# Patient Record
Sex: Female | Born: 1945
Health system: Southern US, Community
[De-identification: ages and names within clinical notes are randomized; demographics above are authoritative.]

## PROBLEM LIST (undated history)

## (undated) DIAGNOSIS — T4145XA Adverse effect of unspecified anesthetic, initial encounter: Secondary | ICD-10-CM

## (undated) DIAGNOSIS — Z8679 Personal history of other diseases of the circulatory system: Secondary | ICD-10-CM

## (undated) DIAGNOSIS — E785 Hyperlipidemia, unspecified: Secondary | ICD-10-CM

## (undated) DIAGNOSIS — K219 Gastro-esophageal reflux disease without esophagitis: Secondary | ICD-10-CM

## (undated) DIAGNOSIS — Z8719 Personal history of other diseases of the digestive system: Secondary | ICD-10-CM

## (undated) DIAGNOSIS — C4491 Basal cell carcinoma of skin, unspecified: Secondary | ICD-10-CM

## (undated) DIAGNOSIS — Z8481 Family history of carrier of genetic disease: Secondary | ICD-10-CM

## (undated) DIAGNOSIS — Z8541 Personal history of malignant neoplasm of cervix uteri: Secondary | ICD-10-CM

## (undated) DIAGNOSIS — Z9889 Other specified postprocedural states: Secondary | ICD-10-CM

## (undated) DIAGNOSIS — J3089 Other allergic rhinitis: Secondary | ICD-10-CM

## (undated) DIAGNOSIS — J189 Pneumonia, unspecified organism: Secondary | ICD-10-CM

## (undated) DIAGNOSIS — M1990 Unspecified osteoarthritis, unspecified site: Secondary | ICD-10-CM

## (undated) DIAGNOSIS — H9190 Unspecified hearing loss, unspecified ear: Secondary | ICD-10-CM

## (undated) DIAGNOSIS — F53 Postpartum depression: Secondary | ICD-10-CM

## (undated) DIAGNOSIS — R0600 Dyspnea, unspecified: Secondary | ICD-10-CM

## (undated) DIAGNOSIS — R011 Cardiac murmur, unspecified: Secondary | ICD-10-CM

## (undated) DIAGNOSIS — R112 Nausea with vomiting, unspecified: Secondary | ICD-10-CM

## (undated) DIAGNOSIS — E039 Hypothyroidism, unspecified: Secondary | ICD-10-CM

## (undated) DIAGNOSIS — E079 Disorder of thyroid, unspecified: Secondary | ICD-10-CM

## (undated) DIAGNOSIS — T8859XA Other complications of anesthesia, initial encounter: Secondary | ICD-10-CM

## (undated) DIAGNOSIS — M719 Bursopathy, unspecified: Secondary | ICD-10-CM

## (undated) DIAGNOSIS — N183 Chronic kidney disease, stage 3 unspecified: Secondary | ICD-10-CM

## (undated) DIAGNOSIS — M797 Fibromyalgia: Secondary | ICD-10-CM

## (undated) DIAGNOSIS — J45909 Unspecified asthma, uncomplicated: Secondary | ICD-10-CM

## (undated) DIAGNOSIS — E559 Vitamin D deficiency, unspecified: Secondary | ICD-10-CM

## (undated) DIAGNOSIS — G43909 Migraine, unspecified, not intractable, without status migrainosus: Secondary | ICD-10-CM

## (undated) DIAGNOSIS — H9319 Tinnitus, unspecified ear: Secondary | ICD-10-CM

## (undated) DIAGNOSIS — I10 Essential (primary) hypertension: Secondary | ICD-10-CM

## (undated) HISTORY — DX: Family history of carrier of genetic disease: Z84.81

## (undated) HISTORY — PX: BREAST LUMPECTOMY: SHX2

## (undated) HISTORY — PX: ELBOW SURGERY: SHX618

## (undated) HISTORY — PX: ABDOMINAL HYSTERECTOMY: SHX81

## (undated) HISTORY — DX: Personal history of malignant neoplasm of cervix uteri: Z85.41

---
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
## 2001-04-15 ENCOUNTER — Other Ambulatory Visit: Admission: RE | Admit: 2001-04-15 | Discharge: 2001-04-15 | Payer: Self-pay | Admitting: Family Medicine

## 2002-04-10 ENCOUNTER — Other Ambulatory Visit: Admission: RE | Admit: 2002-04-10 | Discharge: 2002-04-10 | Payer: Self-pay | Admitting: Family Medicine

## 2002-09-20 ENCOUNTER — Emergency Department (HOSPITAL_COMMUNITY): Admission: EM | Admit: 2002-09-20 | Discharge: 2002-09-20 | Payer: Self-pay | Admitting: Emergency Medicine

## 2002-09-20 ENCOUNTER — Encounter: Payer: Self-pay | Admitting: Emergency Medicine

## 2003-06-16 ENCOUNTER — Other Ambulatory Visit: Admission: RE | Admit: 2003-06-16 | Discharge: 2003-06-16 | Payer: Self-pay | Admitting: Family Medicine

## 2004-08-03 ENCOUNTER — Encounter (HOSPITAL_COMMUNITY): Admission: RE | Admit: 2004-08-03 | Discharge: 2004-08-09 | Payer: Self-pay | Admitting: Family Medicine

## 2004-08-29 ENCOUNTER — Other Ambulatory Visit: Admission: RE | Admit: 2004-08-29 | Discharge: 2004-08-29 | Payer: Self-pay | Admitting: Family Medicine

## 2004-10-17 ENCOUNTER — Encounter (INDEPENDENT_AMBULATORY_CARE_PROVIDER_SITE_OTHER): Payer: Self-pay | Admitting: *Deleted

## 2004-10-17 ENCOUNTER — Ambulatory Visit (HOSPITAL_COMMUNITY): Admission: RE | Admit: 2004-10-17 | Discharge: 2004-10-17 | Payer: Self-pay | Admitting: Gastroenterology

## 2005-09-01 ENCOUNTER — Other Ambulatory Visit: Admission: RE | Admit: 2005-09-01 | Discharge: 2005-09-01 | Payer: Self-pay | Admitting: Family Medicine

## 2005-09-08 ENCOUNTER — Encounter: Admission: RE | Admit: 2005-09-08 | Discharge: 2005-09-08 | Payer: Self-pay | Admitting: Family Medicine

## 2005-09-27 ENCOUNTER — Encounter: Admission: RE | Admit: 2005-09-27 | Discharge: 2005-09-27 | Payer: Self-pay | Admitting: Family Medicine

## 2009-10-28 ENCOUNTER — Emergency Department (HOSPITAL_BASED_OUTPATIENT_CLINIC_OR_DEPARTMENT_OTHER): Admission: EM | Admit: 2009-10-28 | Discharge: 2009-10-28 | Payer: Self-pay | Admitting: Emergency Medicine

## 2011-04-21 NOTE — Op Note (Signed)
NAME:  Susan Webster, Susan Webster               ACCOUNT NO.:  [id number]   MEDICAL RECORD NO.:  [id number]          PATIENT TYPE:  AMB   LOCATION:  ENDO                         FACILITY:  [facility]   PHYSICIAN:  Anselmo Rod, M.D.  DATE OF BIRTH:  05/15/1946   DATE OF PROCEDURE:  10/17/2004  DATE OF DISCHARGE:                                 OPERATIVE REPORT   PROCEDURE PERFORMED:  Colonoscopy with biopsies.   ENDOSCOPIST:  Charna Elizabeth, M.D.   INSTRUMENT USED:  Olympus video colonoscope.   INDICATIONS FOR PROCEDURE:  The patient is a 65 year old white female  undergoing screening colonoscopy.  There is a history of breast and cervical  cancer in her family but no known history of colon cancer.  Rule out colonic  polyps, masses, etc.   PREPROCEDURE PREPARATION:  Informed consent was procured from the patient.  The patient was fasted for eight hours prior to the procedure and prepped  with a bottle of magnesium citrate and a gallon of GoLYTELY the night prior  to the procedure.   PREPROCEDURE PHYSICAL:  The patient had stable vital signs.  Neck supple.  Chest clear to auscultation.  S1 and S2 regular.  Abdomen soft with normal  bowel sounds.   DESCRIPTION OF PROCEDURE:  The patient was placed in left lateral decubitus  position and sedated with 60 mg of Demerol and 6 mg of Versed in slow  incremental doses.  Once the patient was adequately sedated and maintained  on low flow oxygen and continuous cardiac monitoring, the Olympus video  colonoscope was advanced from the rectum to the cecum.  The appendicular  orifice and ileocecal valve were clearly visualized and photographed.  The  terminal ileum appeared normal.  There was a prominent fold biopsied at 7-0  centimeters.  Small internal hemorrhoids were seen on retroflexion.  The  patient tolerated the procedure well without complications.   IMPRESSION:  1.  Sigmoid diverticulosis.  2.  Prominent fold biopsied at 70 cm.  3.  Small internal  hemorrhoids seen on retroflexion.  4.  Normal terminal ileum.   RECOMMENDATIONS:  1.  Continue high fiber diet with liberal fluid intake.  2.  Brochures on diverticulosis have been given to the patient for her      education.  3.  Await pathology results.  4.  Outpatient followup in the next two weeks for further recommendations.  5.  Avoid all nonsteroidals for the next two weeks.       JNM/MEDQ  D:  10/17/2004  T:  10/17/2004  Job:  [id number]   cc:   Talmadge Coventry, M.D.  [address]  [place]  [place] [address]  Fax: [phone]

## 2011-10-25 ENCOUNTER — Emergency Department (HOSPITAL_BASED_OUTPATIENT_CLINIC_OR_DEPARTMENT_OTHER)
Admission: EM | Admit: 2011-10-25 | Discharge: 2011-10-25 | Disposition: A | Discharge status: Home / Self Care | Payer: Medicare Other | Attending: Emergency Medicine | Admitting: Emergency Medicine

## 2011-10-25 ENCOUNTER — Encounter: Payer: Self-pay | Admitting: *Deleted

## 2011-10-25 DIAGNOSIS — R197 Diarrhea, unspecified: Secondary | ICD-10-CM | POA: Insufficient documentation

## 2011-10-25 DIAGNOSIS — E079 Disorder of thyroid, unspecified: Secondary | ICD-10-CM | POA: Insufficient documentation

## 2011-10-25 DIAGNOSIS — I10 Essential (primary) hypertension: Secondary | ICD-10-CM | POA: Insufficient documentation

## 2011-10-25 DIAGNOSIS — R112 Nausea with vomiting, unspecified: Secondary | ICD-10-CM | POA: Insufficient documentation

## 2011-10-25 DIAGNOSIS — IMO0001 Reserved for inherently not codable concepts without codable children: Secondary | ICD-10-CM | POA: Insufficient documentation

## 2011-10-25 DIAGNOSIS — Z8739 Personal history of other diseases of the musculoskeletal system and connective tissue: Secondary | ICD-10-CM | POA: Insufficient documentation

## 2011-10-25 HISTORY — DX: Unspecified osteoarthritis, unspecified site: M19.90

## 2011-10-25 HISTORY — DX: Essential (primary) hypertension: I10

## 2011-10-25 HISTORY — DX: Disorder of thyroid, unspecified: E07.9

## 2011-10-25 HISTORY — DX: Fibromyalgia: M79.7

## 2011-10-25 LAB — COMPREHENSIVE METABOLIC PANEL
BUN: 24 mg/dL — ABNORMAL HIGH (ref 6–23)
CO2: 28 mEq/L (ref 19–32)
Calcium: 9.9 mg/dL (ref 8.4–10.5)
Chloride: 99 mEq/L (ref 96–112)
Creatinine, Ser: 0.8 mg/dL (ref 0.50–1.10)
GFR calc Af Amer: 87 mL/min — ABNORMAL LOW (ref >90)
GFR calc non Af Amer: 75 mL/min — ABNORMAL LOW (ref >90)
Glucose, Bld: 116 mg/dL — ABNORMAL HIGH (ref 70–99)
Total Bilirubin: 0.5 mg/dL (ref 0.3–1.2)

## 2011-10-25 LAB — URINALYSIS, ROUTINE W REFLEX MICROSCOPIC
Glucose, UA: NEGATIVE mg/dL
Hgb urine dipstick: NEGATIVE
Ketones, ur: 15 mg/dL — AB
Nitrite: NEGATIVE
Protein, ur: NEGATIVE mg/dL
Specific Gravity, Urine: 1.031 — ABNORMAL HIGH (ref 1.005–1.030)
Urobilinogen, UA: 1 mg/dL (ref 0.0–1.0)
pH: 5 (ref 5.0–8.0)

## 2011-10-25 LAB — CBC
HCT: 43.8 % (ref 36.0–46.0)
Hemoglobin: 15 g/dL (ref 12.0–15.0)
MCV: 86.9 fL (ref 78.0–100.0)
RBC: 5.04 MIL/uL (ref 3.87–5.11)
RDW: 13 % (ref 11.5–15.5)
WBC: 14.8 10*3/uL — ABNORMAL HIGH (ref 4.0–10.5)

## 2011-10-25 LAB — DIFFERENTIAL
Eosinophils Relative: 0 % (ref 0–5)
Lymphocytes Relative: 6 % — ABNORMAL LOW (ref 12–46)
Lymphs Abs: 0.9 10*3/uL (ref 0.7–4.0)
Monocytes Absolute: 0.4 10*3/uL (ref 0.1–1.0)
Monocytes Relative: 3 % (ref 3–12)

## 2011-10-25 LAB — URINE MICROSCOPIC-ADD ON

## 2011-10-25 MED ORDER — ONDANSETRON 4 MG PO TBDP: 4.0000 mg | ORAL_TABLET | Freq: Three times a day (TID) | ORAL | Status: AC | PRN

## 2011-10-25 MED ORDER — ONDANSETRON HCL 4 MG/2ML IJ SOLN
4.0000 mg | Freq: Once | INTRAMUSCULAR | Status: AC
  Administered 2011-10-25: 4 mg via INTRAVENOUS
  Filled 2011-10-25: qty 2

## 2011-10-25 MED ORDER — SODIUM CHLORIDE 0.9 % IV BOLUS (SEPSIS)
1000.0000 mL | Freq: Once | INTRAVENOUS | Status: AC
  Administered 2011-10-25: 1000 mL via INTRAVENOUS

## 2011-10-25 NOTE — ED Provider Notes (Signed)
History     CSN: [id number] Arrival date & time: 10/25/2011  7:30 PM   First MD Initiated Contact with Patient 10/25/11 2022      Chief Complaint  Patient presents with  . Emesis    (Consider location/radiation/quality/duration/timing/severity/associated sxs/prior treatment) HPI Comments: Pt states that she has had more vomiting then diarrhea:daughter has had similar symptoms this week  Patient is a 65 y.o. female presenting with vomiting. The history is provided by the patient. No language interpreter was used.  Emesis  This is a new problem. The current episode started 6 to 12 hours ago. The problem occurs more than 10 times per day. The problem has not changed since onset.The emesis has an appearance of stomach contents. There has been no fever. Associated symptoms include diarrhea. Pertinent negatives include no abdominal pain and no cough. Risk factors include ill contacts.    Past Medical History  Diagnosis Date  . Hypertension   . Fibromyalgia   . Thyroid disease   . Arthritis     Past Surgical History  Procedure Date  . Abdominal hysterectomy   . Breast lumpectomy   . Elbow surgery     No family history on file.  History  Substance Use Topics  . Smoking status: Never Smoker   . Smokeless tobacco: Not on file  . Alcohol Use: Yes    OB History    Grav Para Term Preterm Abortions TAB SAB Ect Mult Living                  Review of Systems  Respiratory: Negative for cough.   Gastrointestinal: Positive for vomiting and diarrhea. Negative for abdominal pain.  All other systems reviewed and are negative.    Allergies  Review of patient's allergies indicates not on file.  Home Medications  No current outpatient prescriptions on file.  BP 138/86  Pulse 106  Temp(Src) 98.4 F (36.9 C) (Oral)  Resp 21  SpO2 100%  Physical Exam  Vitals reviewed. Constitutional: She is oriented to person, place, and time. She appears well-developed.  HENT:  Right  Ear: External ear normal.  Left Ear: External ear normal.  Mouth/Throat: Mucous membranes are dry.  Eyes: Conjunctivae and EOM are normal.  Neck: Normal range of motion. Neck supple.  Cardiovascular: Normal rate and regular rhythm.   Pulmonary/Chest: Effort normal and breath sounds normal.  Abdominal: Soft.  Musculoskeletal: Normal range of motion.  Neurological: She is alert and oriented to person, place, and time.  Skin: Skin is warm and dry.  Psychiatric: She has a normal mood and affect.    ED Course  Procedures (including critical care time)  Labs Reviewed  URINALYSIS, ROUTINE W REFLEX MICROSCOPIC - Abnormal; Notable for the following:    Appearance CLOUDY (*)    Specific Gravity, Urine 1.031 (*)    Bilirubin Urine SMALL (*)    Ketones, ur 15 (*)    Leukocytes, UA SMALL (*)    All other components within normal limits  CBC - Abnormal; Notable for the following:    WBC 14.8 (*)    All other components within normal limits  DIFFERENTIAL - Abnormal; Notable for the following:    Neutrophils Relative 91 (*)    Neutro Abs 13.5 (*)    Lymphocytes Relative 6 (*)    All other components within normal limits  COMPREHENSIVE METABOLIC PANEL - Abnormal; Notable for the following:    Potassium 3.2 (*)    Glucose, Bld 116 (*)  BUN 24 (*)    GFR calc non Af Amer 75 (*)    GFR calc Af Amer 87 (*)    All other components within normal limits  URINE MICROSCOPIC-ADD ON   No results found.   1. Nausea, vomiting and diarrhea       MDM  Pt is feeling better at this time:pt is tolerating po fluids:symptoms likely viral as other family members with similar symptoms:will treat at home symptomatically        Teressa Lower, NP 10/25/11 2212

## 2011-10-25 NOTE — ED Notes (Signed)
Pt c/o N/V/D that began this am.

## 2011-10-26 NOTE — ED Provider Notes (Signed)
Medical screening examination/treatment/procedure(s) were performed by non-physician practitioner and as supervising physician I was immediately available for consultation/collaboration.   Moody Robben, MD 10/26/11 0007 

## 2012-01-26 DIAGNOSIS — H1045 Other chronic allergic conjunctivitis: Secondary | ICD-10-CM | POA: Diagnosis not present

## 2012-01-26 DIAGNOSIS — H40019 Open angle with borderline findings, low risk, unspecified eye: Secondary | ICD-10-CM | POA: Diagnosis not present

## 2012-01-26 DIAGNOSIS — H01009 Unspecified blepharitis unspecified eye, unspecified eyelid: Secondary | ICD-10-CM | POA: Diagnosis not present

## 2012-01-26 DIAGNOSIS — H04129 Dry eye syndrome of unspecified lacrimal gland: Secondary | ICD-10-CM | POA: Diagnosis not present

## 2012-05-27 DIAGNOSIS — I10 Essential (primary) hypertension: Secondary | ICD-10-CM | POA: Diagnosis not present

## 2012-05-27 DIAGNOSIS — R7301 Impaired fasting glucose: Secondary | ICD-10-CM | POA: Diagnosis not present

## 2012-05-27 DIAGNOSIS — E559 Vitamin D deficiency, unspecified: Secondary | ICD-10-CM | POA: Diagnosis not present

## 2012-05-27 DIAGNOSIS — E039 Hypothyroidism, unspecified: Secondary | ICD-10-CM | POA: Diagnosis not present

## 2012-05-27 DIAGNOSIS — E538 Deficiency of other specified B group vitamins: Secondary | ICD-10-CM | POA: Diagnosis not present

## 2012-06-03 DIAGNOSIS — E785 Hyperlipidemia, unspecified: Secondary | ICD-10-CM | POA: Diagnosis not present

## 2012-06-03 DIAGNOSIS — L570 Actinic keratosis: Secondary | ICD-10-CM | POA: Diagnosis not present

## 2012-06-03 DIAGNOSIS — I10 Essential (primary) hypertension: Secondary | ICD-10-CM | POA: Diagnosis not present

## 2012-06-03 DIAGNOSIS — R7301 Impaired fasting glucose: Secondary | ICD-10-CM | POA: Diagnosis not present

## 2012-06-03 DIAGNOSIS — M25519 Pain in unspecified shoulder: Secondary | ICD-10-CM | POA: Diagnosis not present

## 2012-06-03 DIAGNOSIS — E039 Hypothyroidism, unspecified: Secondary | ICD-10-CM | POA: Diagnosis not present

## 2012-06-10 DIAGNOSIS — M67919 Unspecified disorder of synovium and tendon, unspecified shoulder: Secondary | ICD-10-CM | POA: Diagnosis not present

## 2012-06-10 DIAGNOSIS — M25519 Pain in unspecified shoulder: Secondary | ICD-10-CM | POA: Diagnosis not present

## 2012-06-10 DIAGNOSIS — M719 Bursopathy, unspecified: Secondary | ICD-10-CM | POA: Diagnosis not present

## 2012-07-03 DIAGNOSIS — M67919 Unspecified disorder of synovium and tendon, unspecified shoulder: Secondary | ICD-10-CM | POA: Diagnosis not present

## 2012-07-03 DIAGNOSIS — M719 Bursopathy, unspecified: Secondary | ICD-10-CM | POA: Diagnosis not present

## 2012-07-17 DIAGNOSIS — M7150 Other bursitis, not elsewhere classified, unspecified site: Secondary | ICD-10-CM | POA: Diagnosis not present

## 2012-07-17 DIAGNOSIS — R5381 Other malaise: Secondary | ICD-10-CM | POA: Diagnosis not present

## 2012-07-17 DIAGNOSIS — R5383 Other fatigue: Secondary | ICD-10-CM | POA: Diagnosis not present

## 2012-07-17 DIAGNOSIS — F329 Major depressive disorder, single episode, unspecified: Secondary | ICD-10-CM | POA: Diagnosis not present

## 2012-07-19 DIAGNOSIS — M25519 Pain in unspecified shoulder: Secondary | ICD-10-CM | POA: Diagnosis not present

## 2012-07-24 DIAGNOSIS — M25519 Pain in unspecified shoulder: Secondary | ICD-10-CM | POA: Diagnosis not present

## 2012-07-26 DIAGNOSIS — M25519 Pain in unspecified shoulder: Secondary | ICD-10-CM | POA: Diagnosis not present

## 2012-07-29 DIAGNOSIS — M25519 Pain in unspecified shoulder: Secondary | ICD-10-CM | POA: Diagnosis not present

## 2012-08-12 DIAGNOSIS — M25519 Pain in unspecified shoulder: Secondary | ICD-10-CM | POA: Diagnosis not present

## 2012-08-14 DIAGNOSIS — M25519 Pain in unspecified shoulder: Secondary | ICD-10-CM | POA: Diagnosis not present

## 2012-08-19 DIAGNOSIS — M25519 Pain in unspecified shoulder: Secondary | ICD-10-CM | POA: Diagnosis not present

## 2012-08-21 DIAGNOSIS — M25519 Pain in unspecified shoulder: Secondary | ICD-10-CM | POA: Diagnosis not present

## 2012-08-26 DIAGNOSIS — M25519 Pain in unspecified shoulder: Secondary | ICD-10-CM | POA: Diagnosis not present

## 2012-08-28 DIAGNOSIS — M25519 Pain in unspecified shoulder: Secondary | ICD-10-CM | POA: Diagnosis not present

## 2012-09-02 DIAGNOSIS — M25519 Pain in unspecified shoulder: Secondary | ICD-10-CM | POA: Diagnosis not present

## 2012-09-04 DIAGNOSIS — M25519 Pain in unspecified shoulder: Secondary | ICD-10-CM | POA: Diagnosis not present

## 2012-09-09 DIAGNOSIS — M25519 Pain in unspecified shoulder: Secondary | ICD-10-CM | POA: Diagnosis not present

## 2012-09-11 DIAGNOSIS — M25519 Pain in unspecified shoulder: Secondary | ICD-10-CM | POA: Diagnosis not present

## 2012-10-04 DIAGNOSIS — H01009 Unspecified blepharitis unspecified eye, unspecified eyelid: Secondary | ICD-10-CM | POA: Diagnosis not present

## 2012-10-04 DIAGNOSIS — D3130 Benign neoplasm of unspecified choroid: Secondary | ICD-10-CM | POA: Diagnosis not present

## 2012-10-04 DIAGNOSIS — H43399 Other vitreous opacities, unspecified eye: Secondary | ICD-10-CM | POA: Diagnosis not present

## 2012-10-04 DIAGNOSIS — H2510 Age-related nuclear cataract, unspecified eye: Secondary | ICD-10-CM | POA: Diagnosis not present

## 2012-10-04 DIAGNOSIS — H40019 Open angle with borderline findings, low risk, unspecified eye: Secondary | ICD-10-CM | POA: Diagnosis not present

## 2012-11-08 DIAGNOSIS — H01009 Unspecified blepharitis unspecified eye, unspecified eyelid: Secondary | ICD-10-CM | POA: Diagnosis not present

## 2012-11-08 DIAGNOSIS — H1045 Other chronic allergic conjunctivitis: Secondary | ICD-10-CM | POA: Diagnosis not present

## 2012-11-14 DIAGNOSIS — M899 Disorder of bone, unspecified: Secondary | ICD-10-CM | POA: Diagnosis not present

## 2012-11-14 DIAGNOSIS — M949 Disorder of cartilage, unspecified: Secondary | ICD-10-CM | POA: Diagnosis not present

## 2012-11-14 DIAGNOSIS — Z1231 Encounter for screening mammogram for malignant neoplasm of breast: Secondary | ICD-10-CM | POA: Diagnosis not present

## 2012-12-10 DIAGNOSIS — E538 Deficiency of other specified B group vitamins: Secondary | ICD-10-CM | POA: Diagnosis not present

## 2012-12-10 DIAGNOSIS — Z1331 Encounter for screening for depression: Secondary | ICD-10-CM | POA: Diagnosis not present

## 2012-12-10 DIAGNOSIS — M899 Disorder of bone, unspecified: Secondary | ICD-10-CM | POA: Diagnosis not present

## 2012-12-10 DIAGNOSIS — I10 Essential (primary) hypertension: Secondary | ICD-10-CM | POA: Diagnosis not present

## 2012-12-10 DIAGNOSIS — E785 Hyperlipidemia, unspecified: Secondary | ICD-10-CM | POA: Diagnosis not present

## 2012-12-10 DIAGNOSIS — E039 Hypothyroidism, unspecified: Secondary | ICD-10-CM | POA: Diagnosis not present

## 2012-12-10 DIAGNOSIS — Z23 Encounter for immunization: Secondary | ICD-10-CM | POA: Diagnosis not present

## 2012-12-10 DIAGNOSIS — Z0000 Encounter for general adult medical examination without abnormal findings: Secondary | ICD-10-CM | POA: Diagnosis not present

## 2012-12-19 DIAGNOSIS — E039 Hypothyroidism, unspecified: Secondary | ICD-10-CM | POA: Diagnosis not present

## 2012-12-19 DIAGNOSIS — M25559 Pain in unspecified hip: Secondary | ICD-10-CM | POA: Diagnosis not present

## 2012-12-19 DIAGNOSIS — I10 Essential (primary) hypertension: Secondary | ICD-10-CM | POA: Diagnosis not present

## 2012-12-19 DIAGNOSIS — M76899 Other specified enthesopathies of unspecified lower limb, excluding foot: Secondary | ICD-10-CM | POA: Diagnosis not present

## 2012-12-19 DIAGNOSIS — K219 Gastro-esophageal reflux disease without esophagitis: Secondary | ICD-10-CM | POA: Diagnosis not present

## 2012-12-19 DIAGNOSIS — E785 Hyperlipidemia, unspecified: Secondary | ICD-10-CM | POA: Diagnosis not present

## 2013-01-09 DIAGNOSIS — H40019 Open angle with borderline findings, low risk, unspecified eye: Secondary | ICD-10-CM | POA: Diagnosis not present

## 2013-01-09 DIAGNOSIS — H04129 Dry eye syndrome of unspecified lacrimal gland: Secondary | ICD-10-CM | POA: Diagnosis not present

## 2013-01-09 DIAGNOSIS — H01009 Unspecified blepharitis unspecified eye, unspecified eyelid: Secondary | ICD-10-CM | POA: Diagnosis not present

## 2013-06-18 DIAGNOSIS — I10 Essential (primary) hypertension: Secondary | ICD-10-CM | POA: Diagnosis not present

## 2013-06-18 DIAGNOSIS — E039 Hypothyroidism, unspecified: Secondary | ICD-10-CM | POA: Diagnosis not present

## 2013-06-18 DIAGNOSIS — M899 Disorder of bone, unspecified: Secondary | ICD-10-CM | POA: Diagnosis not present

## 2013-06-18 DIAGNOSIS — M949 Disorder of cartilage, unspecified: Secondary | ICD-10-CM | POA: Diagnosis not present

## 2013-06-18 DIAGNOSIS — E538 Deficiency of other specified B group vitamins: Secondary | ICD-10-CM | POA: Diagnosis not present

## 2013-06-25 DIAGNOSIS — I10 Essential (primary) hypertension: Secondary | ICD-10-CM | POA: Diagnosis not present

## 2013-06-25 DIAGNOSIS — E039 Hypothyroidism, unspecified: Secondary | ICD-10-CM | POA: Diagnosis not present

## 2013-06-25 DIAGNOSIS — J309 Allergic rhinitis, unspecified: Secondary | ICD-10-CM | POA: Diagnosis not present

## 2013-06-25 DIAGNOSIS — E785 Hyperlipidemia, unspecified: Secondary | ICD-10-CM | POA: Diagnosis not present

## 2013-10-10 DIAGNOSIS — H2510 Age-related nuclear cataract, unspecified eye: Secondary | ICD-10-CM | POA: Diagnosis not present

## 2013-10-10 DIAGNOSIS — H01009 Unspecified blepharitis unspecified eye, unspecified eyelid: Secondary | ICD-10-CM | POA: Diagnosis not present

## 2013-10-10 DIAGNOSIS — H1045 Other chronic allergic conjunctivitis: Secondary | ICD-10-CM | POA: Diagnosis not present

## 2013-10-10 DIAGNOSIS — L719 Rosacea, unspecified: Secondary | ICD-10-CM | POA: Diagnosis not present

## 2013-10-10 DIAGNOSIS — H40019 Open angle with borderline findings, low risk, unspecified eye: Secondary | ICD-10-CM | POA: Diagnosis not present

## 2013-10-10 DIAGNOSIS — H43399 Other vitreous opacities, unspecified eye: Secondary | ICD-10-CM | POA: Diagnosis not present

## 2013-12-09 DIAGNOSIS — Z1231 Encounter for screening mammogram for malignant neoplasm of breast: Secondary | ICD-10-CM | POA: Diagnosis not present

## 2013-12-11 DIAGNOSIS — Z0189 Encounter for other specified special examinations: Secondary | ICD-10-CM | POA: Diagnosis not present

## 2013-12-11 DIAGNOSIS — R928 Other abnormal and inconclusive findings on diagnostic imaging of breast: Secondary | ICD-10-CM | POA: Diagnosis not present

## 2013-12-11 DIAGNOSIS — Z803 Family history of malignant neoplasm of breast: Secondary | ICD-10-CM | POA: Diagnosis not present

## 2013-12-23 DIAGNOSIS — M949 Disorder of cartilage, unspecified: Secondary | ICD-10-CM | POA: Diagnosis not present

## 2013-12-23 DIAGNOSIS — E538 Deficiency of other specified B group vitamins: Secondary | ICD-10-CM | POA: Diagnosis not present

## 2013-12-23 DIAGNOSIS — I10 Essential (primary) hypertension: Secondary | ICD-10-CM | POA: Diagnosis not present

## 2013-12-23 DIAGNOSIS — M899 Disorder of bone, unspecified: Secondary | ICD-10-CM | POA: Diagnosis not present

## 2013-12-23 DIAGNOSIS — Z1331 Encounter for screening for depression: Secondary | ICD-10-CM | POA: Diagnosis not present

## 2013-12-23 DIAGNOSIS — E039 Hypothyroidism, unspecified: Secondary | ICD-10-CM | POA: Diagnosis not present

## 2013-12-23 DIAGNOSIS — Z0000 Encounter for general adult medical examination without abnormal findings: Secondary | ICD-10-CM | POA: Diagnosis not present

## 2013-12-30 DIAGNOSIS — K219 Gastro-esophageal reflux disease without esophagitis: Secondary | ICD-10-CM | POA: Diagnosis not present

## 2013-12-30 DIAGNOSIS — E785 Hyperlipidemia, unspecified: Secondary | ICD-10-CM | POA: Diagnosis not present

## 2013-12-30 DIAGNOSIS — I10 Essential (primary) hypertension: Secondary | ICD-10-CM | POA: Diagnosis not present

## 2013-12-30 DIAGNOSIS — E039 Hypothyroidism, unspecified: Secondary | ICD-10-CM | POA: Diagnosis not present

## 2014-02-02 DIAGNOSIS — L259 Unspecified contact dermatitis, unspecified cause: Secondary | ICD-10-CM | POA: Diagnosis not present

## 2014-02-02 DIAGNOSIS — D485 Neoplasm of uncertain behavior of skin: Secondary | ICD-10-CM | POA: Diagnosis not present

## 2014-02-02 DIAGNOSIS — I781 Nevus, non-neoplastic: Secondary | ICD-10-CM | POA: Diagnosis not present

## 2014-03-07 ENCOUNTER — Emergency Department (HOSPITAL_BASED_OUTPATIENT_CLINIC_OR_DEPARTMENT_OTHER)
Admission: EM | Admit: 2014-03-07 | Discharge: 2014-03-08 | Disposition: A | Discharge status: Home / Self Care | Payer: Medicare Other | Attending: Emergency Medicine | Admitting: Emergency Medicine

## 2014-03-07 ENCOUNTER — Emergency Department (HOSPITAL_BASED_OUTPATIENT_CLINIC_OR_DEPARTMENT_OTHER): Payer: Medicare Other

## 2014-03-07 ENCOUNTER — Encounter (HOSPITAL_BASED_OUTPATIENT_CLINIC_OR_DEPARTMENT_OTHER): Payer: Self-pay | Admitting: Emergency Medicine

## 2014-03-07 DIAGNOSIS — S199XXA Unspecified injury of neck, initial encounter: Secondary | ICD-10-CM | POA: Diagnosis not present

## 2014-03-07 DIAGNOSIS — S8990XA Unspecified injury of unspecified lower leg, initial encounter: Secondary | ICD-10-CM | POA: Diagnosis not present

## 2014-03-07 DIAGNOSIS — S60229A Contusion of unspecified hand, initial encounter: Secondary | ICD-10-CM | POA: Diagnosis not present

## 2014-03-07 DIAGNOSIS — S8010XA Contusion of unspecified lower leg, initial encounter: Secondary | ICD-10-CM | POA: Diagnosis not present

## 2014-03-07 DIAGNOSIS — S46909A Unspecified injury of unspecified muscle, fascia and tendon at shoulder and upper arm level, unspecified arm, initial encounter: Secondary | ICD-10-CM | POA: Insufficient documentation

## 2014-03-07 DIAGNOSIS — Y929 Unspecified place or not applicable: Secondary | ICD-10-CM | POA: Insufficient documentation

## 2014-03-07 DIAGNOSIS — S52599A Other fractures of lower end of unspecified radius, initial encounter for closed fracture: Secondary | ICD-10-CM | POA: Insufficient documentation

## 2014-03-07 DIAGNOSIS — M129 Arthropathy, unspecified: Secondary | ICD-10-CM | POA: Insufficient documentation

## 2014-03-07 DIAGNOSIS — Z79899 Other long term (current) drug therapy: Secondary | ICD-10-CM | POA: Insufficient documentation

## 2014-03-07 DIAGNOSIS — S99919A Unspecified injury of unspecified ankle, initial encounter: Secondary | ICD-10-CM | POA: Diagnosis not present

## 2014-03-07 DIAGNOSIS — T07XXXA Unspecified multiple injuries, initial encounter: Secondary | ICD-10-CM

## 2014-03-07 DIAGNOSIS — S52309A Unspecified fracture of shaft of unspecified radius, initial encounter for closed fracture: Secondary | ICD-10-CM | POA: Diagnosis not present

## 2014-03-07 DIAGNOSIS — Y9301 Activity, walking, marching and hiking: Secondary | ICD-10-CM | POA: Insufficient documentation

## 2014-03-07 DIAGNOSIS — Z7982 Long term (current) use of aspirin: Secondary | ICD-10-CM | POA: Diagnosis not present

## 2014-03-07 DIAGNOSIS — S4980XA Other specified injuries of shoulder and upper arm, unspecified arm, initial encounter: Secondary | ICD-10-CM | POA: Insufficient documentation

## 2014-03-07 DIAGNOSIS — IMO0002 Reserved for concepts with insufficient information to code with codable children: Secondary | ICD-10-CM | POA: Insufficient documentation

## 2014-03-07 DIAGNOSIS — S60219A Contusion of unspecified wrist, initial encounter: Secondary | ICD-10-CM | POA: Insufficient documentation

## 2014-03-07 DIAGNOSIS — W108XXA Fall (on) (from) other stairs and steps, initial encounter: Secondary | ICD-10-CM | POA: Insufficient documentation

## 2014-03-07 DIAGNOSIS — S99929A Unspecified injury of unspecified foot, initial encounter: Secondary | ICD-10-CM

## 2014-03-07 DIAGNOSIS — E039 Hypothyroidism, unspecified: Secondary | ICD-10-CM | POA: Diagnosis not present

## 2014-03-07 DIAGNOSIS — S52502A Unspecified fracture of the lower end of left radius, initial encounter for closed fracture: Secondary | ICD-10-CM

## 2014-03-07 DIAGNOSIS — S0993XA Unspecified injury of face, initial encounter: Secondary | ICD-10-CM | POA: Diagnosis not present

## 2014-03-07 DIAGNOSIS — I10 Essential (primary) hypertension: Secondary | ICD-10-CM | POA: Diagnosis not present

## 2014-03-07 HISTORY — DX: Hypothyroidism, unspecified: E03.9

## 2014-03-07 NOTE — ED Provider Notes (Addendum)
CSN: [id number]     Arrival date & time 03/07/14  2048 History  This chart was scribed for Wynetta Fines, MD by Mercy Moore, ED scribe.  This patient was seen in room MH05/MH05 and the patient's care was started at 11:05 PM.   Chief Complaint  Patient presents with  . Fall      The history is provided by the patient. No language interpreter was used.   HPI Comments: Susan Webster is a 68 y.o. female who presents to the Emergency Department after a fall that occurred three hours ago. Patient reports that she was carrying two large 3x4 ft paintings down stairs and tripped over flip flops. Patient fell down three steps once reaching the bottom. Patient denies LOC or head injury. She is now complaining of left wrist, left knee, bilateral elbow, right foot, right should and right back pain. Patient reports that the left wrist pain is most severe, 9/10 and worse with movement or palpation. She is also having trouble moving her left thumb. Patient reports hearing something pull in her right shoulder, but can still move it.   Past Medical History  Diagnosis Date  . Hypertension   . Fibromyalgia   . Thyroid disease   . Arthritis   . Hypothyroid    Past Surgical History  Procedure Laterality Date  . Abdominal hysterectomy    . Breast lumpectomy    . Elbow surgery Left    No family history on file. History  Substance Use Topics  . Smoking status: Never Smoker   . Smokeless tobacco: Never Used  . Alcohol Use: Yes     Comment: seldom   OB History   Grav Para Term Preterm Abortions TAB SAB Ect Mult Living                 Review of Systems  All other systems reviewed and are negative.      Allergies  Codeine  Home Medications   Current Outpatient Rx  Name  Route  Sig  Dispense  Refill  . aspirin EC 81 MG tablet   Oral   Take 81 mg by mouth daily.           Marland Kitchen b complex vitamins tablet   Oral   Take 1 tablet by mouth daily.           . Calcium Carbonate-Vitamin D  (CALTRATE 600+D) 600-400 MG-UNIT per tablet   Oral   Take 1 tablet by mouth daily.           . cetirizine (ZYRTEC) 10 MG tablet   Oral   Take 10 mg by mouth daily.           . cholecalciferol (VITAMIN D) 1000 UNITS tablet   Oral   Take 1,000 Units by mouth daily.           Marland Kitchen levothyroxine (SYNTHROID, LEVOTHROID) 75 MCG tablet   Oral   Take 75 mcg by mouth daily.           Marland Kitchen lisinopril-hydrochlorothiazide (PRINZIDE,ZESTORETIC) 20-25 MG per tablet   Oral   Take 1 tablet by mouth daily.           . magnesium oxide (MAG-OX) 400 MG tablet   Oral   Take 400 mg by mouth daily.           . Multiple Vitamin (MULTIVITAMIN) tablet   Oral   Take 1 tablet by mouth daily.           Marland Kitchen  pantoprazole (PROTONIX) 40 MG tablet   Oral   Take 40 mg by mouth daily.           Marland Kitchen SIMVASTATIN PO   Oral   Take 1 tablet by mouth daily.            Triage Vitals: BP 139/72  Pulse 78  Temp(Src) 98.3 F (36.8 C) (Oral)  Resp 20  Ht 5' 3.5" (1.613 m)  Wt 179 lb (81.194 kg)  BMI 31.21 kg/m2  SpO2 98%  Physical Exam  Nursing note and vitals reviewed.  General: Well-developed, well-nourished female in no acute distress; appearance consistent with age of record HENT: normocephalic; atraumatic Eyes: pupils equal, round and reactive to light; extraocular muscles intact; nomal Neck: supple; mild c-spine tenderness; tenderness to the soft tissues of right medial scapula of the spine Heart: regular rate and rhythm; no murmurs, rubs or gallops Lungs: clear to auscultation bilaterally; breath sounds normal Abdomen: soft; nondistended; nontender; no masses or hepatosplenomegaly; bowel sounds present Extremities:  Right upper extremity: neurovascuarly intact with intact tendon functions; full ROM Left upper extremity: remarkable for healing echymosis of the dorsum of the Tarpley and wrist with small cental hematoma (Patient states this is due to dog bite ten days ago) with tenderness over  the distal left radius, able to flex and extend her thumb but unable to adduct or abduct it; extrinsic and intrinsic function of other fingers intact; sensation intact. Superficial hematoma inferior to the right patella; Superfical abrasion to left patella; very mild tenderness of the right ankle Neurologic: Awake, alert and oriented; motor function intact in all extremities and symmetric; no facial droop Skin: Warm and dry Psychiatric: Normal mood and affect   ED Course  Procedures (including critical care time) DIAGNOSTIC STUDIES: Oxygen Saturation is 98% on room air, normal by my interpretation.    COORDINATION OF CARE: 11:16 PM- Pt advised of plan for treatment and pt agrees.  MDM  Nursing notes and vitals signs, including pulse oximetry, reviewed.  Summary of this visit's results, reviewed by myself:  Labs:  No results found for this or any previous visit (from the past 24 hour(s)).  Imaging Studies: Dg Cervical Spine Complete  03/08/2014   CLINICAL DATA:  Fall  EXAM: CERVICAL SPINE  4+ VIEWS  COMPARISON:  None available  FINDINGS: No acute fracture or listhesis. Slight reversal of the normal cervical lordosis with apex at C5. There is trace anterolisthesis of C4 on C5. Moderate degenerative intervertebral disc space narrowing with endplate osteophytosis is present at C5-6 and C6-7 parent no prevertebral soft tissue swelling. Normal C1-2 articulations are intact.  IMPRESSION: 1. No acute traumatic injury within the cervical spine. 2. Moderate degenerative disc disease at C5-6 and C6-7.   Electronically Signed   By: Jeannine Boga M.D.   On: 03/08/2014 00:24   Dg Shoulder Right  03/08/2014   CLINICAL DATA:  Fall  EXAM: RIGHT SHOULDER - 2+ VIEW  COMPARISON:  Prior radiograph from 06/03/2012  FINDINGS: No acute fracture or dislocation. AC joint is approximated. Humeral head is in normal alignment with the glenoid. Degenerative spurring present at the [employer] joint. Subacromial  calcification again noted. No soft tissue abnormality.  IMPRESSION: 1. No acute fracture or dislocation. 2. Calcific density within the subacromial space, which may reflect sequelae of calcific tendinitis or bursitis. This finding is stable as compared to prior radiograph from 06/03/2012.   Electronically Signed   By: Jeannine Boga M.D.   On: 03/08/2014 00:25  Dg Wrist Complete Left  03/07/2014   CLINICAL DATA:  Fall.  EXAM: LEFT WRIST - COMPLETE 3+ VIEW  COMPARISON:  None.  FINDINGS: Distal left radial metaphysis minimally displaced fracture is present. Diffuse osteopenia. Mild degenerative change.  IMPRESSION: Minimally displaced distal left radial metaphysis fracture. Ulnar styloid intact.   Electronically Signed   By: Marcello Moores  Register   On: 03/07/2014 21:36      I personally performed the services described in this documentation, which was scribed in my presence. The recorded information has been reviewed and is accurate.    Wynetta Fines, MD 03/08/14 0031  Wynetta Fines, MD 03/08/14 6387  Wynetta Fines, MD 03/08/14 5643

## 2014-03-07 NOTE — ED Notes (Signed)
Pt reports she was walking down steps carrying large painting and tripped over flip flops and fell down 3 steps- C/o pain in left wrist, left knee, bilateral elbows, right shoulder and back pain- denies LOC

## 2014-03-08 MED ORDER — HYDROCODONE-ACETAMINOPHEN 5-325 MG PO TABS: 1.0000 | ORAL_TABLET | Freq: Four times a day (QID) | ORAL | Status: DC | PRN

## 2014-03-08 MED ORDER — IBUPROFEN 800 MG PO TABS
800.0000 mg | ORAL_TABLET | Freq: Once | ORAL | Status: AC
  Administered 2014-03-08: 800 mg via ORAL

## 2014-03-08 MED ORDER — IBUPROFEN 800 MG PO TABS
ORAL_TABLET | ORAL | Status: AC
  Filled 2014-03-08: qty 1

## 2014-03-08 MED ORDER — HYDROCODONE-ACETAMINOPHEN 5-325 MG PO TABS
1.0000 | ORAL_TABLET | Freq: Once | ORAL | Status: DC
  Filled 2014-03-08: qty 1

## 2014-03-08 NOTE — ED Notes (Signed)
LUE splint

## 2014-03-10 DIAGNOSIS — S52599A Other fractures of lower end of unspecified radius, initial encounter for closed fracture: Secondary | ICD-10-CM | POA: Diagnosis not present

## 2014-03-24 DIAGNOSIS — S5290XD Unspecified fracture of unspecified forearm, subsequent encounter for closed fracture with routine healing: Secondary | ICD-10-CM | POA: Diagnosis not present

## 2014-03-31 DIAGNOSIS — E785 Hyperlipidemia, unspecified: Secondary | ICD-10-CM | POA: Diagnosis not present

## 2014-03-31 DIAGNOSIS — E039 Hypothyroidism, unspecified: Secondary | ICD-10-CM | POA: Diagnosis not present

## 2014-04-07 DIAGNOSIS — E039 Hypothyroidism, unspecified: Secondary | ICD-10-CM | POA: Diagnosis not present

## 2014-04-07 DIAGNOSIS — E785 Hyperlipidemia, unspecified: Secondary | ICD-10-CM | POA: Diagnosis not present

## 2014-04-07 DIAGNOSIS — K219 Gastro-esophageal reflux disease without esophagitis: Secondary | ICD-10-CM | POA: Diagnosis not present

## 2014-04-07 DIAGNOSIS — I10 Essential (primary) hypertension: Secondary | ICD-10-CM | POA: Diagnosis not present

## 2014-04-09 DIAGNOSIS — M25639 Stiffness of unspecified wrist, not elsewhere classified: Secondary | ICD-10-CM | POA: Diagnosis not present

## 2014-04-09 DIAGNOSIS — S5290XD Unspecified fracture of unspecified forearm, subsequent encounter for closed fracture with routine healing: Secondary | ICD-10-CM | POA: Diagnosis not present

## 2014-04-15 DIAGNOSIS — M25639 Stiffness of unspecified wrist, not elsewhere classified: Secondary | ICD-10-CM | POA: Diagnosis not present

## 2014-04-22 DIAGNOSIS — M25639 Stiffness of unspecified wrist, not elsewhere classified: Secondary | ICD-10-CM | POA: Diagnosis not present

## 2014-04-30 DIAGNOSIS — M25639 Stiffness of unspecified wrist, not elsewhere classified: Secondary | ICD-10-CM | POA: Diagnosis not present

## 2014-05-01 DIAGNOSIS — S5290XD Unspecified fracture of unspecified forearm, subsequent encounter for closed fracture with routine healing: Secondary | ICD-10-CM | POA: Diagnosis not present

## 2014-05-06 DIAGNOSIS — M25639 Stiffness of unspecified wrist, not elsewhere classified: Secondary | ICD-10-CM | POA: Diagnosis not present

## 2014-05-13 DIAGNOSIS — H40019 Open angle with borderline findings, low risk, unspecified eye: Secondary | ICD-10-CM | POA: Diagnosis not present

## 2014-05-13 DIAGNOSIS — M25639 Stiffness of unspecified wrist, not elsewhere classified: Secondary | ICD-10-CM | POA: Diagnosis not present

## 2014-05-20 DIAGNOSIS — M25639 Stiffness of unspecified wrist, not elsewhere classified: Secondary | ICD-10-CM | POA: Diagnosis not present

## 2014-05-27 DIAGNOSIS — M25639 Stiffness of unspecified wrist, not elsewhere classified: Secondary | ICD-10-CM | POA: Diagnosis not present

## 2014-06-08 DIAGNOSIS — S5290XD Unspecified fracture of unspecified forearm, subsequent encounter for closed fracture with routine healing: Secondary | ICD-10-CM | POA: Diagnosis not present

## 2014-06-10 DIAGNOSIS — Z09 Encounter for follow-up examination after completed treatment for conditions other than malignant neoplasm: Secondary | ICD-10-CM | POA: Diagnosis not present

## 2014-06-10 DIAGNOSIS — R928 Other abnormal and inconclusive findings on diagnostic imaging of breast: Secondary | ICD-10-CM | POA: Diagnosis not present

## 2014-07-01 DIAGNOSIS — E538 Deficiency of other specified B group vitamins: Secondary | ICD-10-CM | POA: Diagnosis not present

## 2014-07-01 DIAGNOSIS — E039 Hypothyroidism, unspecified: Secondary | ICD-10-CM | POA: Diagnosis not present

## 2014-07-01 DIAGNOSIS — E785 Hyperlipidemia, unspecified: Secondary | ICD-10-CM | POA: Diagnosis not present

## 2014-07-01 DIAGNOSIS — I10 Essential (primary) hypertension: Secondary | ICD-10-CM | POA: Diagnosis not present

## 2014-07-01 DIAGNOSIS — E559 Vitamin D deficiency, unspecified: Secondary | ICD-10-CM | POA: Diagnosis not present

## 2014-07-08 DIAGNOSIS — E559 Vitamin D deficiency, unspecified: Secondary | ICD-10-CM | POA: Diagnosis not present

## 2014-07-08 DIAGNOSIS — I10 Essential (primary) hypertension: Secondary | ICD-10-CM | POA: Diagnosis not present

## 2014-07-08 DIAGNOSIS — E785 Hyperlipidemia, unspecified: Secondary | ICD-10-CM | POA: Diagnosis not present

## 2014-07-08 DIAGNOSIS — E039 Hypothyroidism, unspecified: Secondary | ICD-10-CM | POA: Diagnosis not present

## 2014-07-24 ENCOUNTER — Telehealth: Payer: Self-pay | Admitting: *Deleted

## 2014-07-24 NOTE — Telephone Encounter (Signed)
Pt called stating that she is the sister of a current pt and another pt who is seeing Ofri today for genetics and was requesting to schedule genetics for herself.  Scheduled and confirmed her for 08/12/14 to keep her with Ofri.  Called Ofri to make her aware.

## 2014-08-12 ENCOUNTER — Encounter: Payer: Self-pay | Admitting: Genetic Counselor

## 2014-08-12 ENCOUNTER — Other Ambulatory Visit: Payer: PRIVATE HEALTH INSURANCE

## 2014-08-12 ENCOUNTER — Ambulatory Visit (HOSPITAL_BASED_OUTPATIENT_CLINIC_OR_DEPARTMENT_OTHER): Payer: Medicare Other | Admitting: Genetic Counselor

## 2014-08-12 DIAGNOSIS — Z808 Family history of malignant neoplasm of other organs or systems: Secondary | ICD-10-CM

## 2014-08-12 DIAGNOSIS — IMO0002 Reserved for concepts with insufficient information to code with codable children: Secondary | ICD-10-CM | POA: Diagnosis not present

## 2014-08-12 DIAGNOSIS — Z803 Family history of malignant neoplasm of breast: Secondary | ICD-10-CM | POA: Diagnosis not present

## 2014-08-12 DIAGNOSIS — Z8481 Family history of carrier of genetic disease: Secondary | ICD-10-CM

## 2014-08-12 DIAGNOSIS — Z8541 Personal history of malignant neoplasm of cervix uteri: Secondary | ICD-10-CM | POA: Diagnosis not present

## 2014-08-12 NOTE — Progress Notes (Signed)
Patient Name: Jorita Bohanon Sunday Patient Age: 68 y.o. Encounter Date: 08/12/2014  Referring Physician: SELF  Primary Care Provider: Thressa Sheller, MD   Ms. Elmon Kirschner Spinella, a 68 y.o. female, is being seen at the [facility] due to a family history of breast cancer and a recently identified BRCA2 mutation in her family.  She presents to clinic today with her daughter, Ebony Cargo, to discuss genetic testing for the familial mutation.  HISTORY OF PRESENT ILLNESS: Ms. Coryell reports that she was diagnosed with cervical cancer in her 80s and had cryo. Otherwise, she has no other history of cancer.   She has a history of 2 negative breast biopsied and a right breast lumpectomy at age 27 or 49 that was benign.   Past Medical History  Diagnosis Date  . Hypertension   . Fibromyalgia   . Thyroid disease   . Arthritis   . Hypothyroid   . FHx: BRCA2 gene positive     BRCA2 mutation in sister  . History of cervical cancer     Past Surgical History  Procedure Laterality Date  . Abdominal hysterectomy    . Breast lumpectomy    . Elbow surgery Left     History   Social History  . Marital Status: Divorced    Spouse Name: N/A    Number of Children: N/A  . Years of Education: N/A   Social History Main Topics  . Smoking status: Never Smoker   . Smokeless tobacco: Never Used  . Alcohol Use: Yes     Comment: seldom  . Drug Use: No  . Sexual Activity: Not on file   Other Topics Concern  . Not on file   Social History Narrative  . No narrative on file     FAMILY HISTORY:   During the visit, a 4-generation pedigree was obtained. Significant diagnoses include the following:  Family History  Problem Relation Age of Onset  . Breast cancer Sister 72    Recurrence at 67; BRCA2 mutation  . Cancer Maternal Uncle     head/neck ca 60s  . Cancer Maternal Grandmother     possible stomach cancer; deceased 2s  . Breast cancer Sister 64    Currently 40; genetic testing pending  .  Breast cancer Other     pat grandmother's sister    Her sisters who had breast cancer at age 107 was seen here and was found to have a BRCA2 pathogenic mutation. The mutation is [id number] ([id number]*) and analysis was done at [facility].   Additionally, Ms. Mauger's daughter Ebony Cargo) is 82 and her son is 20. Her mother died at 39 cancer-free. Her mother had one brother noted above as well as a sister (age 26s) who is cancer-free. Her father died at 52, cancer-free. He had only one sister (age 25) and she has 2 sons.  Ms. Swiney ancestry is Caucasian. There is no known Jewish ancestry and no consanguinity.   ASSESSMENT AND PLAN: Ms. Casserly is a 68 y.o. female with a family history of breast cancer as noted above and a pathogenic BRCA2 mutation identified in one of her sisters. We reviewed the characteristics, features and inheritance patterns of hereditary cancer syndromes, and the cancer risks associated with a BRCA2 mutation. We also discussed genetic testing, including the appropriate family members to test, the process of testing, insurance coverage and implications of results.   Ms. Labate did not wish to pursue genetic testing today because of the costs of testing. Medicare will  not pay for this testing in someone who has not had breast or ovarian cancer. We continue to recommend testing, and remain available to coordinate testing at any time in the future. Ms. Holsclaw daughter will proceed with testing today. If her test is positive, it will mean Ms. Barrett also harbors the BRCA2 mutation. If her daughter's test is negative, however, it will not inform Ms. Tuckey's genetic status at all.  We recommended Ms. Strother continue to follow the cancer screening guidelines given to her by her primary provider. In general, women should have a yearly mammogram, a yearly clinical breast exam, perform monthly breast self-exams and have a yearly gynecologic exam. Colon cancer screening is recommended if she has  not already initiated it.  We encouraged Ms. Geisen to remain in contact with Cancer Genetics annually so that we can update the family history and inform her of any changes in cancer genetics and testing that may be of benefit for this family. Ms.  Tatem questions were answered to her satisfaction today.   Thank you for the referral and allowing [place] to share in the care of your patient.   The patient was seen for a total of 30 minutes, greater than 50% of which was spent face-to-face counseling. This patient was discussed with the overseeing provider who agrees with the above.   Steele Berg, MS, [place] Certified Genetic Counseor phone: [phone] [email or web address]

## 2014-09-17 DIAGNOSIS — K5900 Constipation, unspecified: Secondary | ICD-10-CM | POA: Diagnosis not present

## 2014-09-17 DIAGNOSIS — Z1211 Encounter for screening for malignant neoplasm of colon: Secondary | ICD-10-CM | POA: Diagnosis not present

## 2014-10-08 DIAGNOSIS — M859 Disorder of bone density and structure, unspecified: Secondary | ICD-10-CM | POA: Diagnosis not present

## 2014-10-08 DIAGNOSIS — I10 Essential (primary) hypertension: Secondary | ICD-10-CM | POA: Diagnosis not present

## 2014-10-08 DIAGNOSIS — E538 Deficiency of other specified B group vitamins: Secondary | ICD-10-CM | POA: Diagnosis not present

## 2014-10-14 DIAGNOSIS — E785 Hyperlipidemia, unspecified: Secondary | ICD-10-CM | POA: Diagnosis not present

## 2014-10-14 DIAGNOSIS — I10 Essential (primary) hypertension: Secondary | ICD-10-CM | POA: Diagnosis not present

## 2014-10-14 DIAGNOSIS — K219 Gastro-esophageal reflux disease without esophagitis: Secondary | ICD-10-CM | POA: Diagnosis not present

## 2014-10-14 DIAGNOSIS — E039 Hypothyroidism, unspecified: Secondary | ICD-10-CM | POA: Diagnosis not present

## 2014-10-14 DIAGNOSIS — J45909 Unspecified asthma, uncomplicated: Secondary | ICD-10-CM | POA: Diagnosis not present

## 2014-10-21 DIAGNOSIS — Z1211 Encounter for screening for malignant neoplasm of colon: Secondary | ICD-10-CM | POA: Diagnosis not present

## 2014-10-21 DIAGNOSIS — K5900 Constipation, unspecified: Secondary | ICD-10-CM | POA: Diagnosis not present

## 2014-10-21 DIAGNOSIS — K5730 Diverticulosis of large intestine without perforation or abscess without bleeding: Secondary | ICD-10-CM | POA: Diagnosis not present

## 2014-11-12 DIAGNOSIS — M1712 Unilateral primary osteoarthritis, left knee: Secondary | ICD-10-CM | POA: Diagnosis not present

## 2014-11-12 DIAGNOSIS — M25562 Pain in left knee: Secondary | ICD-10-CM | POA: Diagnosis not present

## 2014-11-16 DIAGNOSIS — M150 Primary generalized (osteo)arthritis: Secondary | ICD-10-CM | POA: Diagnosis not present

## 2014-11-16 DIAGNOSIS — M2550 Pain in unspecified joint: Secondary | ICD-10-CM | POA: Diagnosis not present

## 2014-11-16 DIAGNOSIS — M25569 Pain in unspecified knee: Secondary | ICD-10-CM | POA: Diagnosis not present

## 2014-12-21 DIAGNOSIS — H40013 Open angle with borderline findings, low risk, bilateral: Secondary | ICD-10-CM | POA: Diagnosis not present

## 2014-12-21 DIAGNOSIS — H04123 Dry eye syndrome of bilateral lacrimal glands: Secondary | ICD-10-CM | POA: Diagnosis not present

## 2014-12-21 DIAGNOSIS — H2513 Age-related nuclear cataract, bilateral: Secondary | ICD-10-CM | POA: Diagnosis not present

## 2014-12-21 DIAGNOSIS — H43393 Other vitreous opacities, bilateral: Secondary | ICD-10-CM | POA: Diagnosis not present

## 2014-12-21 DIAGNOSIS — D3131 Benign neoplasm of right choroid: Secondary | ICD-10-CM | POA: Diagnosis not present

## 2015-01-14 DIAGNOSIS — E785 Hyperlipidemia, unspecified: Secondary | ICD-10-CM | POA: Diagnosis not present

## 2015-01-14 DIAGNOSIS — M859 Disorder of bone density and structure, unspecified: Secondary | ICD-10-CM | POA: Diagnosis not present

## 2015-01-14 DIAGNOSIS — Z1389 Encounter for screening for other disorder: Secondary | ICD-10-CM | POA: Diagnosis not present

## 2015-01-14 DIAGNOSIS — D81818 Other biotin-dependent carboxylase deficiency: Secondary | ICD-10-CM | POA: Diagnosis not present

## 2015-01-14 DIAGNOSIS — E039 Hypothyroidism, unspecified: Secondary | ICD-10-CM | POA: Diagnosis not present

## 2015-01-14 DIAGNOSIS — Z0000 Encounter for general adult medical examination without abnormal findings: Secondary | ICD-10-CM | POA: Diagnosis not present

## 2015-01-14 DIAGNOSIS — I10 Essential (primary) hypertension: Secondary | ICD-10-CM | POA: Diagnosis not present

## 2015-01-25 DIAGNOSIS — M8580 Other specified disorders of bone density and structure, unspecified site: Secondary | ICD-10-CM | POA: Diagnosis not present

## 2015-01-25 DIAGNOSIS — I10 Essential (primary) hypertension: Secondary | ICD-10-CM | POA: Diagnosis not present

## 2015-01-25 DIAGNOSIS — E039 Hypothyroidism, unspecified: Secondary | ICD-10-CM | POA: Diagnosis not present

## 2015-01-25 DIAGNOSIS — Z23 Encounter for immunization: Secondary | ICD-10-CM | POA: Diagnosis not present

## 2015-01-25 DIAGNOSIS — E785 Hyperlipidemia, unspecified: Secondary | ICD-10-CM | POA: Diagnosis not present

## 2015-04-19 DIAGNOSIS — M8580 Other specified disorders of bone density and structure, unspecified site: Secondary | ICD-10-CM | POA: Diagnosis not present

## 2015-04-19 DIAGNOSIS — E039 Hypothyroidism, unspecified: Secondary | ICD-10-CM | POA: Diagnosis not present

## 2015-04-19 DIAGNOSIS — I10 Essential (primary) hypertension: Secondary | ICD-10-CM | POA: Diagnosis not present

## 2015-04-19 DIAGNOSIS — D81818 Other biotin-dependent carboxylase deficiency: Secondary | ICD-10-CM | POA: Diagnosis not present

## 2015-04-19 DIAGNOSIS — E559 Vitamin D deficiency, unspecified: Secondary | ICD-10-CM | POA: Diagnosis not present

## 2015-04-26 DIAGNOSIS — E785 Hyperlipidemia, unspecified: Secondary | ICD-10-CM | POA: Diagnosis not present

## 2015-04-26 DIAGNOSIS — E039 Hypothyroidism, unspecified: Secondary | ICD-10-CM | POA: Diagnosis not present

## 2015-04-26 DIAGNOSIS — M179 Osteoarthritis of knee, unspecified: Secondary | ICD-10-CM | POA: Diagnosis not present

## 2015-04-26 DIAGNOSIS — I10 Essential (primary) hypertension: Secondary | ICD-10-CM | POA: Diagnosis not present

## 2015-05-28 DIAGNOSIS — L03032 Cellulitis of left toe: Secondary | ICD-10-CM | POA: Diagnosis not present

## 2015-06-10 DIAGNOSIS — T148 Other injury of unspecified body region: Secondary | ICD-10-CM | POA: Diagnosis not present

## 2015-07-05 IMAGING — CR DG WRIST COMPLETE 3+V*L*
4 series · 4 of 4 positions shown · non-contrast
Comparison: None.

CLINICAL DATA: Fall.

EXAM:
LEFT WRIST - COMPLETE 3+ VIEW

[x wrist pa left]
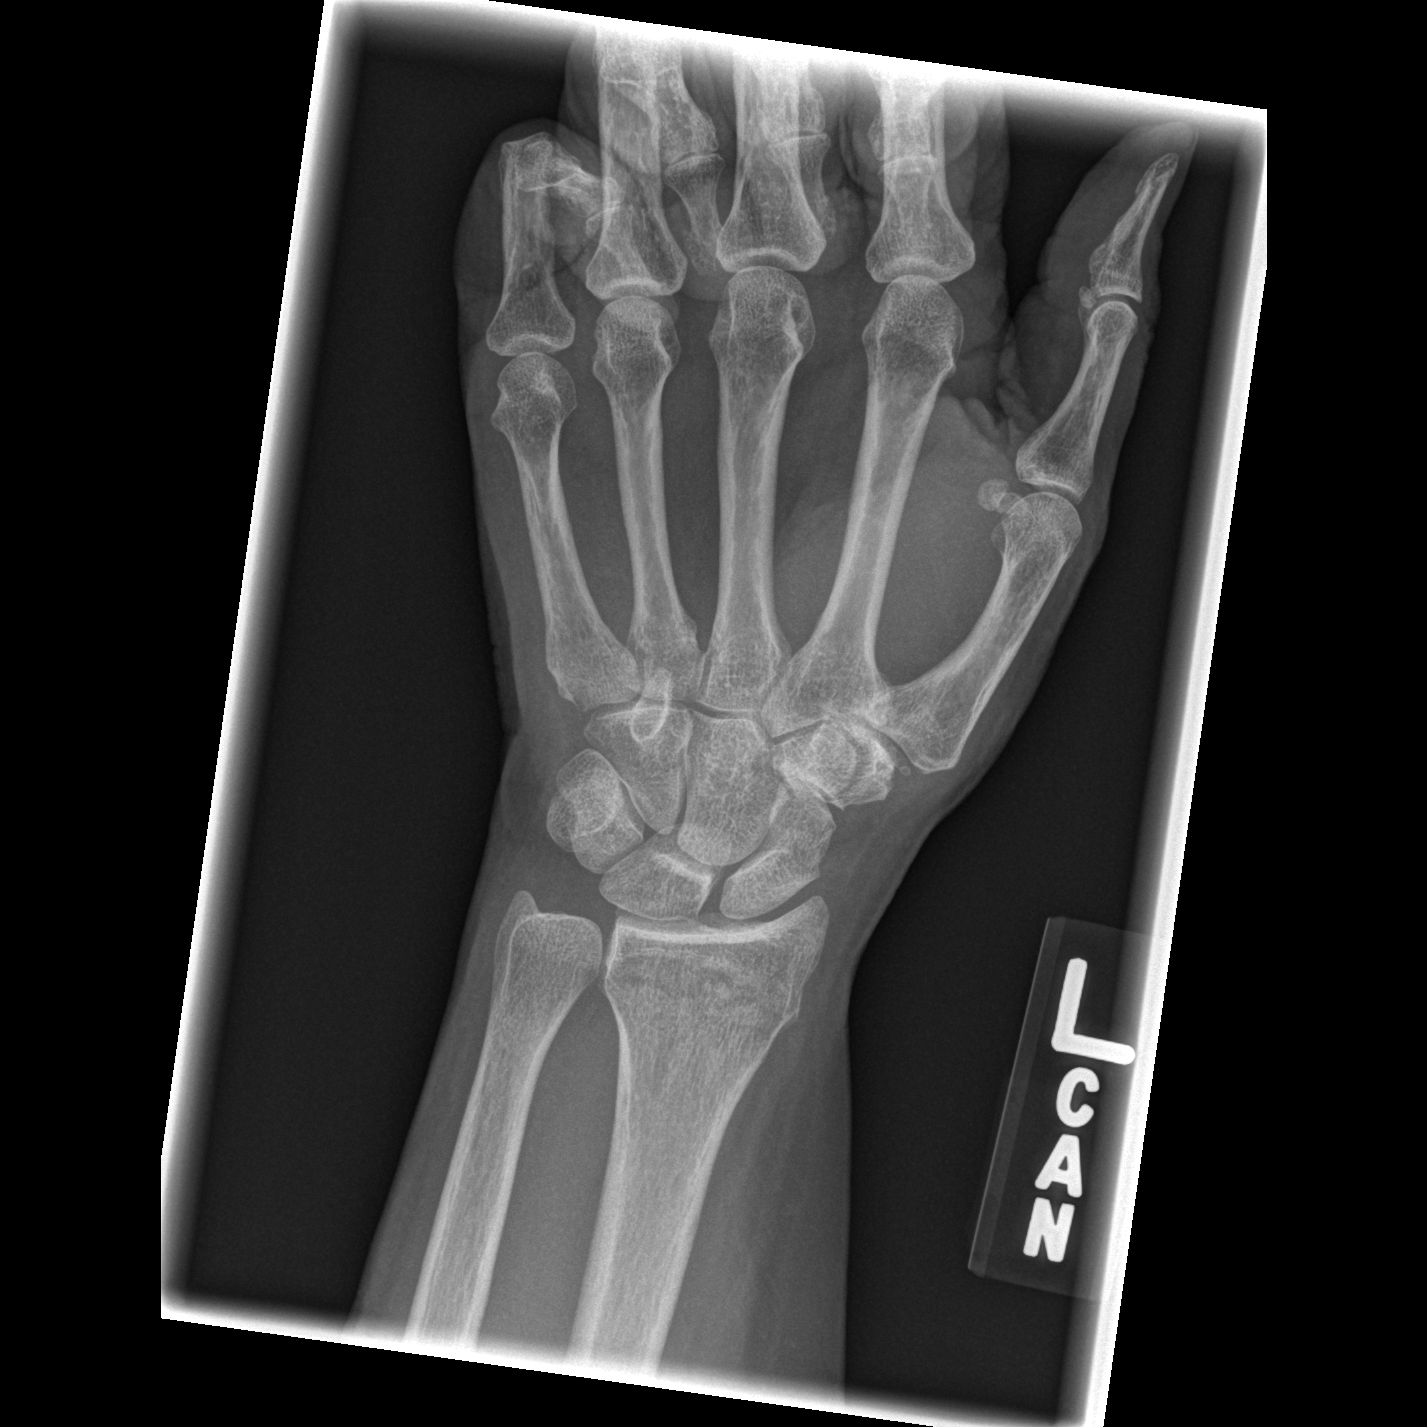

[x wrist obl left]
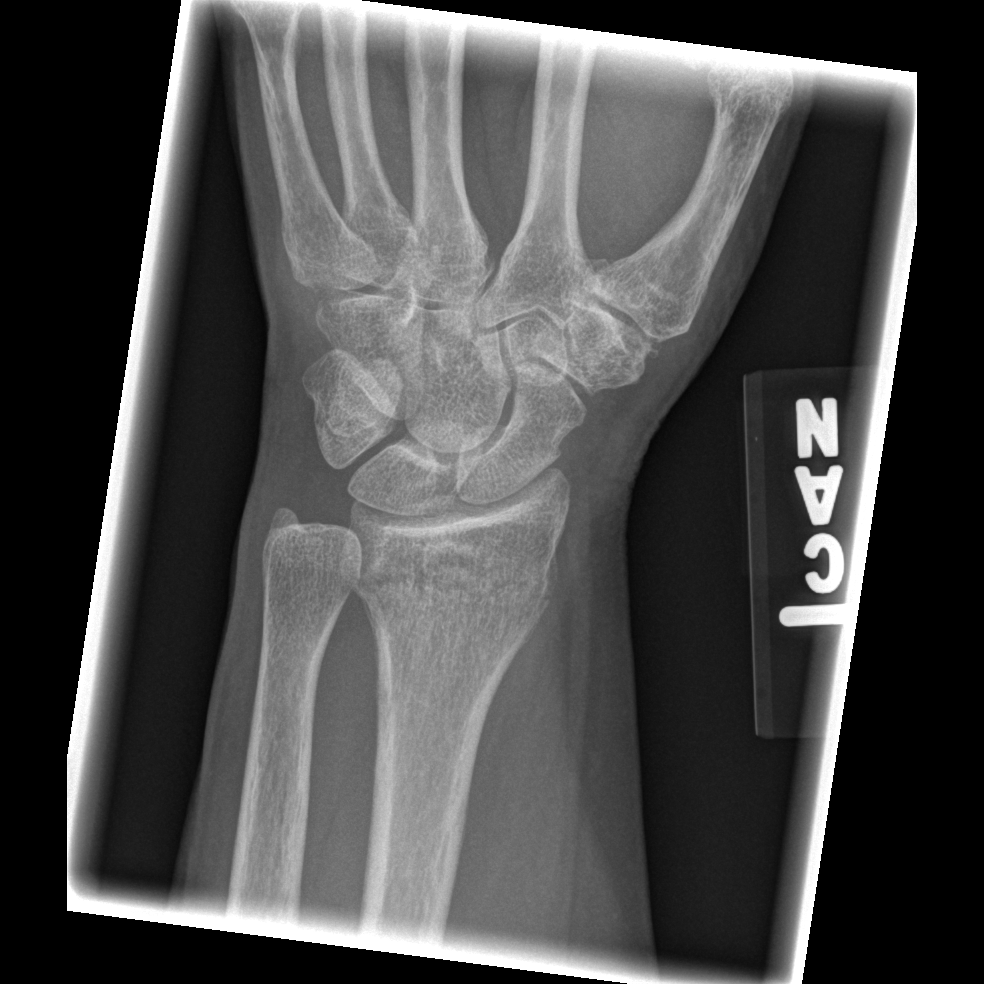

[x wrist lat left]
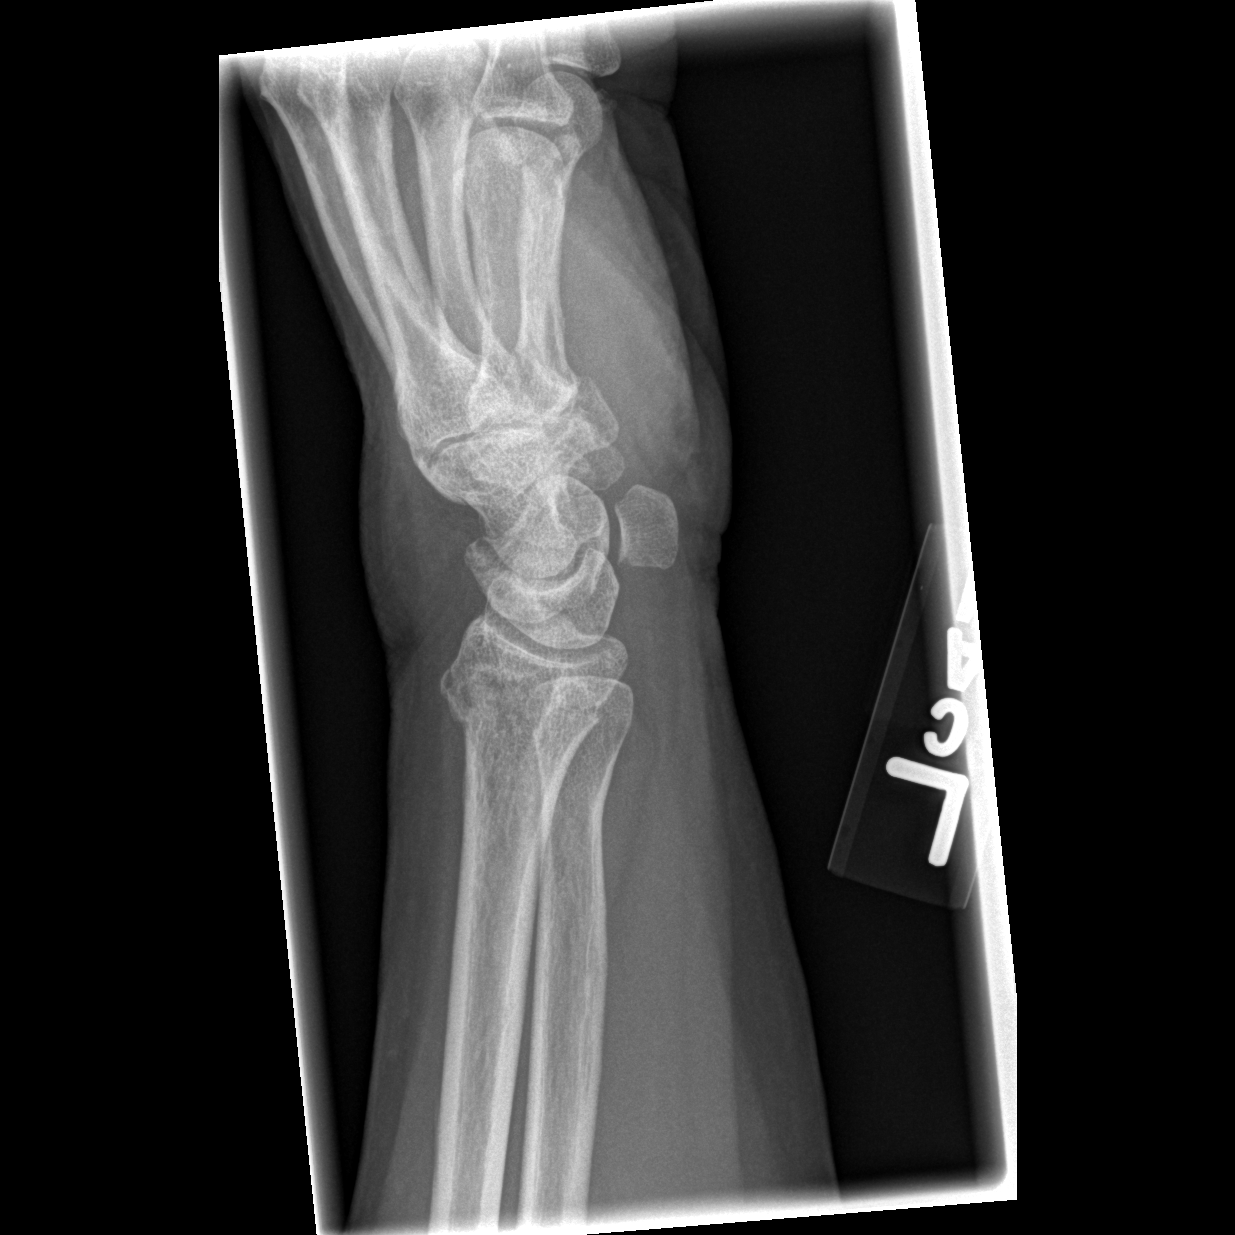

[x navicular]
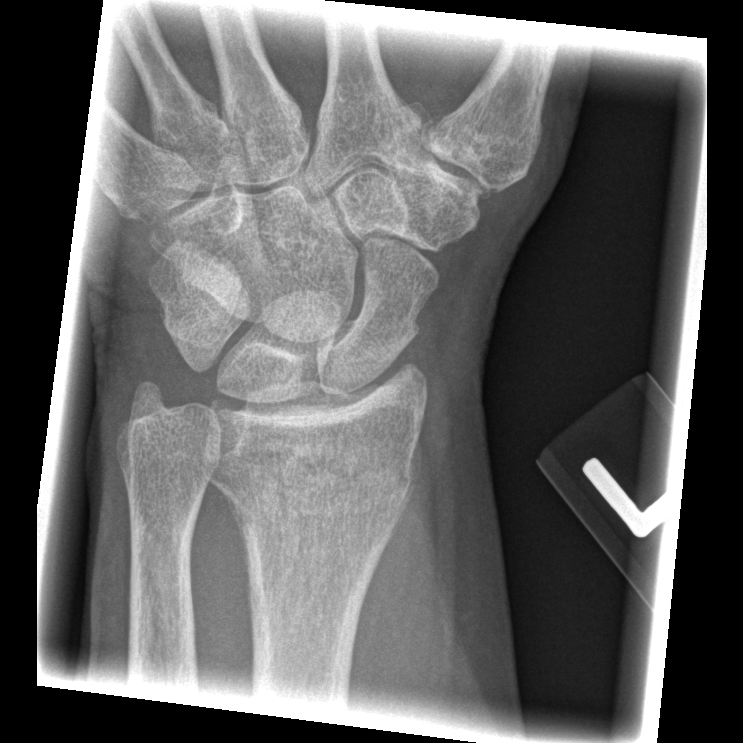

[4 of 4 positions shown; findings below may reference images not displayed]

FINDINGS: Distal left radial metaphysis minimally displaced fracture is
present. Diffuse osteopenia. Mild degenerative change.
IMPRESSION: Minimally displaced distal left radial metaphysis fracture. Ulnar
styloid intact.

## 2015-07-21 DIAGNOSIS — M8580 Other specified disorders of bone density and structure, unspecified site: Secondary | ICD-10-CM | POA: Diagnosis not present

## 2015-07-21 DIAGNOSIS — E039 Hypothyroidism, unspecified: Secondary | ICD-10-CM | POA: Diagnosis not present

## 2015-07-21 DIAGNOSIS — E559 Vitamin D deficiency, unspecified: Secondary | ICD-10-CM | POA: Diagnosis not present

## 2015-07-21 DIAGNOSIS — E538 Deficiency of other specified B group vitamins: Secondary | ICD-10-CM | POA: Diagnosis not present

## 2015-07-21 DIAGNOSIS — I10 Essential (primary) hypertension: Secondary | ICD-10-CM | POA: Diagnosis not present

## 2015-07-27 DIAGNOSIS — D692 Other nonthrombocytopenic purpura: Secondary | ICD-10-CM | POA: Diagnosis not present

## 2015-07-27 DIAGNOSIS — E785 Hyperlipidemia, unspecified: Secondary | ICD-10-CM | POA: Diagnosis not present

## 2015-07-27 DIAGNOSIS — M542 Cervicalgia: Secondary | ICD-10-CM | POA: Diagnosis not present

## 2015-07-27 DIAGNOSIS — F339 Major depressive disorder, recurrent, unspecified: Secondary | ICD-10-CM | POA: Diagnosis not present

## 2015-07-27 DIAGNOSIS — Z23 Encounter for immunization: Secondary | ICD-10-CM | POA: Diagnosis not present

## 2015-07-27 DIAGNOSIS — I129 Hypertensive chronic kidney disease with stage 1 through stage 4 chronic kidney disease, or unspecified chronic kidney disease: Secondary | ICD-10-CM | POA: Diagnosis not present

## 2015-07-28 DIAGNOSIS — M4312 Spondylolisthesis, cervical region: Secondary | ICD-10-CM | POA: Diagnosis not present

## 2015-07-28 DIAGNOSIS — M47812 Spondylosis without myelopathy or radiculopathy, cervical region: Secondary | ICD-10-CM | POA: Diagnosis not present

## 2015-07-29 DIAGNOSIS — M797 Fibromyalgia: Secondary | ICD-10-CM | POA: Diagnosis not present

## 2015-07-29 DIAGNOSIS — M542 Cervicalgia: Secondary | ICD-10-CM | POA: Diagnosis not present

## 2015-08-02 DIAGNOSIS — M542 Cervicalgia: Secondary | ICD-10-CM | POA: Diagnosis not present

## 2015-08-02 DIAGNOSIS — M797 Fibromyalgia: Secondary | ICD-10-CM | POA: Diagnosis not present

## 2015-08-05 DIAGNOSIS — M542 Cervicalgia: Secondary | ICD-10-CM | POA: Diagnosis not present

## 2015-08-05 DIAGNOSIS — M797 Fibromyalgia: Secondary | ICD-10-CM | POA: Diagnosis not present

## 2015-08-06 DIAGNOSIS — Z1231 Encounter for screening mammogram for malignant neoplasm of breast: Secondary | ICD-10-CM | POA: Diagnosis not present

## 2015-08-10 DIAGNOSIS — M542 Cervicalgia: Secondary | ICD-10-CM | POA: Diagnosis not present

## 2015-08-10 DIAGNOSIS — M797 Fibromyalgia: Secondary | ICD-10-CM | POA: Diagnosis not present

## 2015-08-12 DIAGNOSIS — M542 Cervicalgia: Secondary | ICD-10-CM | POA: Diagnosis not present

## 2015-08-12 DIAGNOSIS — M797 Fibromyalgia: Secondary | ICD-10-CM | POA: Diagnosis not present

## 2015-08-16 DIAGNOSIS — M797 Fibromyalgia: Secondary | ICD-10-CM | POA: Diagnosis not present

## 2015-08-16 DIAGNOSIS — M542 Cervicalgia: Secondary | ICD-10-CM | POA: Diagnosis not present

## 2015-08-19 DIAGNOSIS — M797 Fibromyalgia: Secondary | ICD-10-CM | POA: Diagnosis not present

## 2015-08-19 DIAGNOSIS — M542 Cervicalgia: Secondary | ICD-10-CM | POA: Diagnosis not present

## 2015-08-30 DIAGNOSIS — M797 Fibromyalgia: Secondary | ICD-10-CM | POA: Diagnosis not present

## 2015-08-30 DIAGNOSIS — M542 Cervicalgia: Secondary | ICD-10-CM | POA: Diagnosis not present

## 2015-09-02 DIAGNOSIS — M797 Fibromyalgia: Secondary | ICD-10-CM | POA: Diagnosis not present

## 2015-09-02 DIAGNOSIS — M542 Cervicalgia: Secondary | ICD-10-CM | POA: Diagnosis not present

## 2015-11-18 DIAGNOSIS — H40013 Open angle with borderline findings, low risk, bilateral: Secondary | ICD-10-CM | POA: Diagnosis not present

## 2016-01-03 ENCOUNTER — Emergency Department (INDEPENDENT_AMBULATORY_CARE_PROVIDER_SITE_OTHER): Payer: Commercial Managed Care - HMO

## 2016-01-03 ENCOUNTER — Emergency Department (INDEPENDENT_AMBULATORY_CARE_PROVIDER_SITE_OTHER)
Admission: EM | Admit: 2016-01-03 | Discharge: 2016-01-03 | Disposition: A | Discharge status: Home / Self Care | Payer: Commercial Managed Care - HMO | Source: Home / Self Care | Attending: Family Medicine | Admitting: Family Medicine

## 2016-01-03 ENCOUNTER — Encounter (HOSPITAL_COMMUNITY): Payer: Self-pay | Admitting: *Deleted

## 2016-01-03 DIAGNOSIS — R05 Cough: Secondary | ICD-10-CM | POA: Diagnosis not present

## 2016-01-03 DIAGNOSIS — J069 Acute upper respiratory infection, unspecified: Secondary | ICD-10-CM

## 2016-01-03 DIAGNOSIS — R509 Fever, unspecified: Secondary | ICD-10-CM | POA: Diagnosis not present

## 2016-01-03 MED ORDER — AZITHROMYCIN 250 MG PO TABS: ORAL_TABLET | ORAL | Status: DC

## 2016-01-03 MED ORDER — DEXTROMETHORPHAN POLISTIREX ER 30 MG/5ML PO SUER: 60.0000 mg | Freq: Two times a day (BID) | ORAL | Status: DC

## 2016-01-03 NOTE — ED Notes (Signed)
Cough    Congested     X  5  Days       Symptoms  Not  releived  By       [facility]  meds        pt  Sitting  Upright on  The  Exam table  Speaking in  Complete    sentances

## 2016-01-03 NOTE — ED Provider Notes (Signed)
CSN: [id number]     Arrival date & time 01/03/16  1524 History   First MD Initiated Contact with Patient 01/03/16 1748     Chief Complaint  Patient presents with  . Cough   (Consider location/radiation/quality/duration/timing/severity/associated sxs/prior Treatment) Patient is a 69 y.o. female presenting with cough. The history is provided by the patient.  Cough Cough characteristics:  Productive Sputum characteristics:  Yellow Severity:  Moderate Onset quality:  Sudden Duration:  5 days Progression:  Unchanged Chronicity:  New Smoker: no   Context: upper respiratory infection   Associated symptoms: chills, fever, myalgias, rhinorrhea and shortness of breath     Past Medical History  Diagnosis Date  . Hypertension   . Fibromyalgia   . Thyroid disease   . Arthritis   . Hypothyroid   . FHx: BRCA2 gene positive     BRCA2 mutation in sister  . History of cervical cancer    Past Surgical History  Procedure Laterality Date  . Abdominal hysterectomy    . Breast lumpectomy    . Elbow surgery Left    Family History  Problem Relation Age of Onset  . Breast cancer Sister 38    Recurrence at 58; BRCA2 mutation  . Cancer Maternal Uncle     head/neck ca 60s  . Cancer Maternal Grandmother     possible stomach cancer; deceased 50s  . Breast cancer Sister 44    Currently 67; genetic testing pending  . Breast cancer Other     pat grandmother's sister   Social History  Substance Use Topics  . Smoking status: Never Smoker   . Smokeless tobacco: Never Used  . Alcohol Use: Yes     Comment: seldom   OB History    No data available     Review of Systems  Constitutional: Positive for fever and chills.  HENT: Positive for rhinorrhea.   Respiratory: Positive for cough and shortness of breath.   Cardiovascular: Negative.   Gastrointestinal: Negative.   Genitourinary: Negative.   Musculoskeletal: Positive for myalgias.  All other systems reviewed and are  negative.   Allergies  Vicodin and Codeine  Home Medications   Prior to Admission medications   Medication Sig Start Date End Date Taking? Authorizing Provider  aspirin EC 81 MG tablet Take 81 mg by mouth daily.      Historical Provider, MD  azithromycin (ZITHROMAX Z-PAK) 250 MG tablet Take as directed on pack 01/03/16   James D Kindl, MD  b complex vitamins tablet Take 1 tablet by mouth daily.      Historical Provider, MD  Calcium Carbonate-Vitamin D (CALTRATE 600+D) 600-400 MG-UNIT per tablet Take 1 tablet by mouth daily.      Historical Provider, MD  cetirizine (ZYRTEC) 10 MG tablet Take 10 mg by mouth daily.      Historical Provider, MD  cholecalciferol (VITAMIN D) 1000 UNITS tablet Take 1,000 Units by mouth daily.      Historical Provider, MD  dextromethorphan (DELSYM) 30 MG/5ML liquid Take 10 mLs (60 mg total) by mouth 2 (two) times daily. As needed for cough 01/03/16   James D Kindl, MD  HYDROcodone-acetaminophen (NORCO/VICODIN) 5-325 MG per tablet Take 1-2 tablets by mouth every 6 (six) hours as needed for moderate pain. 03/08/14   John Molpus, MD  levothyroxine (SYNTHROID, LEVOTHROID) 75 MCG tablet Take 75 mcg by mouth daily.      Historical Provider, MD  lisinopril-hydrochlorothiazide (PRINZIDE,ZESTORETIC) 20-25 MG per tablet Take 1 tablet by mouth daily.        Historical Provider, MD  magnesium oxide (MAG-OX) 400 MG tablet Take 400 mg by mouth daily.      Historical Provider, MD  Multiple Vitamin (MULTIVITAMIN) tablet Take 1 tablet by mouth daily.      Historical Provider, MD  pantoprazole (PROTONIX) 40 MG tablet Take 40 mg by mouth daily.      Historical Provider, MD  SIMVASTATIN PO Take 1 tablet by mouth daily.      Historical Provider, MD   Meds Ordered and Administered this Visit  Medications - No data to display  BP 125/84 mmHg  Pulse 104  Temp(Src) 99.3 F (37.4 C) (Oral)  Resp 18  SpO2 94% No data found.   Physical Exam  Constitutional: She is oriented to person,  place, and time. She appears well-developed and well-nourished. No distress.  HENT:  Right Ear: External ear normal.  Left Ear: External ear normal.  Mouth/Throat: Oropharynx is clear and moist.  Neck: Normal range of motion. Neck supple.  Cardiovascular: Normal heart sounds and intact distal pulses.   Pulmonary/Chest: Effort normal. She has rales.  Abdominal: Soft. Bowel sounds are normal.  Lymphadenopathy:    She has no cervical adenopathy.  Neurological: She is alert and oriented to person, place, and time.  Skin: Skin is warm and dry.  Nursing note and vitals reviewed.   ED Course  Procedures (including critical care time)  Labs Review Labs Reviewed - No data to display  Imaging Review Dg Chest 2 View  01/03/2016  CLINICAL DATA:  Fever, cough and congestion for 4 days. EXAM: CHEST  2 VIEW COMPARISON:  02/12/2008 FINDINGS: The cardiomediastinal contours are normal. The lungs are clear. Pulmonary vasculature is normal. No consolidation, pleural effusion, or pneumothorax. No acute osseous abnormalities are seen. IMPRESSION: No acute pulmonary process. Electronically Signed   By: Jeb Levering M.D.   On: 01/03/2016 18:05   X-rays reviewed and report per radiologist.   Visual Acuity Review  Right Eye Distance:   Left Eye Distance:   Bilateral Distance:    Right Eye Near:   Left Eye Near:    Bilateral Near:         MDM   1. URI (upper respiratory infection)    Meds ordered this encounter  Medications  . azithromycin (ZITHROMAX Z-PAK) 250 MG tablet    Sig: Take as directed on pack    Dispense:  6 tablet    Refill:  0  . dextromethorphan (DELSYM) 30 MG/5ML liquid    Sig: Take 10 mLs (60 mg total) by mouth 2 (two) times daily. As needed for cough    Dispense:  89 mL    Refill:  0       Billy Fischer, MD 01/03/16 [phone]

## 2016-01-03 NOTE — Discharge Instructions (Signed)
Take medicine as directed, drink plenty of fluids, see your doctor if further problems

## 2016-01-10 DIAGNOSIS — J069 Acute upper respiratory infection, unspecified: Secondary | ICD-10-CM | POA: Diagnosis not present

## 2016-01-10 MED FILL — BENZONATATE 200 MG CAPSULE: 200 | 15 days supply | Qty: 45 | Fill #0

## 2016-01-10 MED FILL — predniSONE 10 MG TABS: 10 | 6 days supply | Qty: 21 | Fill #0

## 2016-01-10 MED FILL — IPRAT-ALBUT 0.5-3(2.5) MG/3: 0.5-2.5 (3) | 10 days supply | Qty: 90 | Fill #0

## 2016-01-25 DIAGNOSIS — E039 Hypothyroidism, unspecified: Secondary | ICD-10-CM | POA: Diagnosis not present

## 2016-01-25 DIAGNOSIS — Z79899 Other long term (current) drug therapy: Secondary | ICD-10-CM | POA: Diagnosis not present

## 2016-01-25 DIAGNOSIS — E559 Vitamin D deficiency, unspecified: Secondary | ICD-10-CM | POA: Diagnosis not present

## 2016-01-25 DIAGNOSIS — E538 Deficiency of other specified B group vitamins: Secondary | ICD-10-CM | POA: Diagnosis not present

## 2016-01-25 DIAGNOSIS — I129 Hypertensive chronic kidney disease with stage 1 through stage 4 chronic kidney disease, or unspecified chronic kidney disease: Secondary | ICD-10-CM | POA: Diagnosis not present

## 2016-01-25 DIAGNOSIS — E785 Hyperlipidemia, unspecified: Secondary | ICD-10-CM | POA: Diagnosis not present

## 2016-01-25 DIAGNOSIS — Z0000 Encounter for general adult medical examination without abnormal findings: Secondary | ICD-10-CM | POA: Diagnosis not present

## 2016-01-25 DIAGNOSIS — Z13220 Encounter for screening for lipoid disorders: Secondary | ICD-10-CM | POA: Diagnosis not present

## 2016-01-25 DIAGNOSIS — Z1389 Encounter for screening for other disorder: Secondary | ICD-10-CM | POA: Diagnosis not present

## 2016-01-25 DIAGNOSIS — I10 Essential (primary) hypertension: Secondary | ICD-10-CM | POA: Diagnosis not present

## 2016-02-01 DIAGNOSIS — F3341 Major depressive disorder, recurrent, in partial remission: Secondary | ICD-10-CM | POA: Diagnosis not present

## 2016-02-01 DIAGNOSIS — I129 Hypertensive chronic kidney disease with stage 1 through stage 4 chronic kidney disease, or unspecified chronic kidney disease: Secondary | ICD-10-CM | POA: Diagnosis not present

## 2016-02-01 DIAGNOSIS — E039 Hypothyroidism, unspecified: Secondary | ICD-10-CM | POA: Diagnosis not present

## 2016-02-01 DIAGNOSIS — N183 Chronic kidney disease, stage 3 (moderate): Secondary | ICD-10-CM | POA: Diagnosis not present

## 2016-02-01 DIAGNOSIS — E785 Hyperlipidemia, unspecified: Secondary | ICD-10-CM | POA: Diagnosis not present

## 2016-04-26 DIAGNOSIS — E538 Deficiency of other specified B group vitamins: Secondary | ICD-10-CM | POA: Diagnosis not present

## 2016-04-26 DIAGNOSIS — M8580 Other specified disorders of bone density and structure, unspecified site: Secondary | ICD-10-CM | POA: Diagnosis not present

## 2016-04-26 DIAGNOSIS — I129 Hypertensive chronic kidney disease with stage 1 through stage 4 chronic kidney disease, or unspecified chronic kidney disease: Secondary | ICD-10-CM | POA: Diagnosis not present

## 2016-04-26 DIAGNOSIS — E559 Vitamin D deficiency, unspecified: Secondary | ICD-10-CM | POA: Diagnosis not present

## 2016-05-03 DIAGNOSIS — F3341 Major depressive disorder, recurrent, in partial remission: Secondary | ICD-10-CM | POA: Diagnosis not present

## 2016-05-03 DIAGNOSIS — N183 Chronic kidney disease, stage 3 (moderate): Secondary | ICD-10-CM | POA: Diagnosis not present

## 2016-05-03 DIAGNOSIS — E785 Hyperlipidemia, unspecified: Secondary | ICD-10-CM | POA: Diagnosis not present

## 2016-05-03 DIAGNOSIS — I129 Hypertensive chronic kidney disease with stage 1 through stage 4 chronic kidney disease, or unspecified chronic kidney disease: Secondary | ICD-10-CM | POA: Diagnosis not present

## 2016-05-22 DIAGNOSIS — Z1283 Encounter for screening for malignant neoplasm of skin: Secondary | ICD-10-CM | POA: Diagnosis not present

## 2016-05-22 DIAGNOSIS — I8310 Varicose veins of unspecified lower extremity with inflammation: Secondary | ICD-10-CM | POA: Diagnosis not present

## 2016-05-22 DIAGNOSIS — D225 Melanocytic nevi of trunk: Secondary | ICD-10-CM | POA: Diagnosis not present

## 2016-07-05 DIAGNOSIS — D3131 Benign neoplasm of right choroid: Secondary | ICD-10-CM | POA: Diagnosis not present

## 2016-07-05 DIAGNOSIS — H2513 Age-related nuclear cataract, bilateral: Secondary | ICD-10-CM | POA: Diagnosis not present

## 2016-07-05 DIAGNOSIS — H40013 Open angle with borderline findings, low risk, bilateral: Secondary | ICD-10-CM | POA: Diagnosis not present

## 2016-07-05 DIAGNOSIS — H25013 Cortical age-related cataract, bilateral: Secondary | ICD-10-CM | POA: Diagnosis not present

## 2016-10-24 DIAGNOSIS — I129 Hypertensive chronic kidney disease with stage 1 through stage 4 chronic kidney disease, or unspecified chronic kidney disease: Secondary | ICD-10-CM | POA: Diagnosis not present

## 2016-10-24 DIAGNOSIS — E538 Deficiency of other specified B group vitamins: Secondary | ICD-10-CM | POA: Diagnosis not present

## 2016-10-24 DIAGNOSIS — M8580 Other specified disorders of bone density and structure, unspecified site: Secondary | ICD-10-CM | POA: Diagnosis not present

## 2016-10-30 DIAGNOSIS — Z0000 Encounter for general adult medical examination without abnormal findings: Secondary | ICD-10-CM | POA: Diagnosis not present

## 2016-10-30 DIAGNOSIS — I129 Hypertensive chronic kidney disease with stage 1 through stage 4 chronic kidney disease, or unspecified chronic kidney disease: Secondary | ICD-10-CM | POA: Diagnosis not present

## 2016-10-30 DIAGNOSIS — E785 Hyperlipidemia, unspecified: Secondary | ICD-10-CM | POA: Diagnosis not present

## 2016-10-30 DIAGNOSIS — Z23 Encounter for immunization: Secondary | ICD-10-CM | POA: Diagnosis not present

## 2016-11-02 DIAGNOSIS — I129 Hypertensive chronic kidney disease with stage 1 through stage 4 chronic kidney disease, or unspecified chronic kidney disease: Secondary | ICD-10-CM | POA: Diagnosis not present

## 2016-11-02 DIAGNOSIS — Z0000 Encounter for general adult medical examination without abnormal findings: Secondary | ICD-10-CM | POA: Diagnosis not present

## 2016-11-02 DIAGNOSIS — N183 Chronic kidney disease, stage 3 (moderate): Secondary | ICD-10-CM | POA: Diagnosis not present

## 2016-11-02 DIAGNOSIS — F3341 Major depressive disorder, recurrent, in partial remission: Secondary | ICD-10-CM | POA: Diagnosis not present

## 2016-11-24 ENCOUNTER — Other Ambulatory Visit: Payer: Self-pay | Admitting: *Deleted

## 2016-11-24 DIAGNOSIS — I83813 Varicose veins of bilateral lower extremities with pain: Secondary | ICD-10-CM

## 2017-01-09 DIAGNOSIS — Z803 Family history of malignant neoplasm of breast: Secondary | ICD-10-CM | POA: Diagnosis not present

## 2017-01-09 DIAGNOSIS — Z1231 Encounter for screening mammogram for malignant neoplasm of breast: Secondary | ICD-10-CM | POA: Diagnosis not present

## 2017-01-23 ENCOUNTER — Encounter (HOSPITAL_COMMUNITY): Payer: Commercial Managed Care - HMO

## 2017-01-23 ENCOUNTER — Encounter: Payer: Commercial Managed Care - HMO | Admitting: Vascular Surgery

## 2017-01-23 DIAGNOSIS — M533 Sacrococcygeal disorders, not elsewhere classified: Secondary | ICD-10-CM | POA: Diagnosis not present

## 2017-01-23 DIAGNOSIS — S3992XA Unspecified injury of lower back, initial encounter: Secondary | ICD-10-CM | POA: Diagnosis not present

## 2017-01-23 DIAGNOSIS — M797 Fibromyalgia: Secondary | ICD-10-CM | POA: Diagnosis not present

## 2017-04-24 DIAGNOSIS — I10 Essential (primary) hypertension: Secondary | ICD-10-CM | POA: Diagnosis not present

## 2017-04-24 DIAGNOSIS — E785 Hyperlipidemia, unspecified: Secondary | ICD-10-CM | POA: Diagnosis not present

## 2017-04-24 DIAGNOSIS — E538 Deficiency of other specified B group vitamins: Secondary | ICD-10-CM | POA: Diagnosis not present

## 2017-04-24 DIAGNOSIS — I129 Hypertensive chronic kidney disease with stage 1 through stage 4 chronic kidney disease, or unspecified chronic kidney disease: Secondary | ICD-10-CM | POA: Diagnosis not present

## 2017-04-24 DIAGNOSIS — E039 Hypothyroidism, unspecified: Secondary | ICD-10-CM | POA: Diagnosis not present

## 2017-04-24 DIAGNOSIS — N390 Urinary tract infection, site not specified: Secondary | ICD-10-CM | POA: Diagnosis not present

## 2017-05-07 DIAGNOSIS — M25552 Pain in left hip: Secondary | ICD-10-CM | POA: Diagnosis not present

## 2017-05-07 DIAGNOSIS — I129 Hypertensive chronic kidney disease with stage 1 through stage 4 chronic kidney disease, or unspecified chronic kidney disease: Secondary | ICD-10-CM | POA: Diagnosis not present

## 2017-05-07 DIAGNOSIS — E785 Hyperlipidemia, unspecified: Secondary | ICD-10-CM | POA: Diagnosis not present

## 2017-05-07 DIAGNOSIS — N183 Chronic kidney disease, stage 3 (moderate): Secondary | ICD-10-CM | POA: Diagnosis not present

## 2017-05-07 DIAGNOSIS — F3341 Major depressive disorder, recurrent, in partial remission: Secondary | ICD-10-CM | POA: Diagnosis not present

## 2017-07-05 DIAGNOSIS — M7060 Trochanteric bursitis, unspecified hip: Secondary | ICD-10-CM | POA: Diagnosis not present

## 2017-07-05 DIAGNOSIS — M1990 Unspecified osteoarthritis, unspecified site: Secondary | ICD-10-CM | POA: Diagnosis not present

## 2017-07-05 DIAGNOSIS — M25559 Pain in unspecified hip: Secondary | ICD-10-CM | POA: Diagnosis not present

## 2017-07-10 DIAGNOSIS — H25013 Cortical age-related cataract, bilateral: Secondary | ICD-10-CM | POA: Diagnosis not present

## 2017-07-10 DIAGNOSIS — H04123 Dry eye syndrome of bilateral lacrimal glands: Secondary | ICD-10-CM | POA: Diagnosis not present

## 2017-07-10 DIAGNOSIS — H2513 Age-related nuclear cataract, bilateral: Secondary | ICD-10-CM | POA: Diagnosis not present

## 2017-07-10 DIAGNOSIS — H40013 Open angle with borderline findings, low risk, bilateral: Secondary | ICD-10-CM | POA: Diagnosis not present

## 2017-09-18 DIAGNOSIS — D3131 Benign neoplasm of right choroid: Secondary | ICD-10-CM | POA: Diagnosis not present

## 2017-09-18 DIAGNOSIS — H2511 Age-related nuclear cataract, right eye: Secondary | ICD-10-CM | POA: Diagnosis not present

## 2017-09-18 DIAGNOSIS — H25013 Cortical age-related cataract, bilateral: Secondary | ICD-10-CM | POA: Diagnosis not present

## 2017-09-18 DIAGNOSIS — H04123 Dry eye syndrome of bilateral lacrimal glands: Secondary | ICD-10-CM | POA: Diagnosis not present

## 2017-09-18 DIAGNOSIS — H25011 Cortical age-related cataract, right eye: Secondary | ICD-10-CM | POA: Diagnosis not present

## 2017-09-18 DIAGNOSIS — H40013 Open angle with borderline findings, low risk, bilateral: Secondary | ICD-10-CM | POA: Diagnosis not present

## 2017-09-18 DIAGNOSIS — H2513 Age-related nuclear cataract, bilateral: Secondary | ICD-10-CM | POA: Diagnosis not present

## 2017-10-30 DIAGNOSIS — E538 Deficiency of other specified B group vitamins: Secondary | ICD-10-CM | POA: Diagnosis not present

## 2017-10-30 DIAGNOSIS — I129 Hypertensive chronic kidney disease with stage 1 through stage 4 chronic kidney disease, or unspecified chronic kidney disease: Secondary | ICD-10-CM | POA: Diagnosis not present

## 2017-10-30 DIAGNOSIS — Z0000 Encounter for general adult medical examination without abnormal findings: Secondary | ICD-10-CM | POA: Diagnosis not present

## 2017-10-30 DIAGNOSIS — E785 Hyperlipidemia, unspecified: Secondary | ICD-10-CM | POA: Diagnosis not present

## 2017-10-30 DIAGNOSIS — E559 Vitamin D deficiency, unspecified: Secondary | ICD-10-CM | POA: Diagnosis not present

## 2017-10-30 DIAGNOSIS — Z23 Encounter for immunization: Secondary | ICD-10-CM | POA: Diagnosis not present

## 2017-10-30 DIAGNOSIS — E039 Hypothyroidism, unspecified: Secondary | ICD-10-CM | POA: Diagnosis not present

## 2017-11-06 DIAGNOSIS — Z0000 Encounter for general adult medical examination without abnormal findings: Secondary | ICD-10-CM | POA: Diagnosis not present

## 2017-11-06 DIAGNOSIS — Z01419 Encounter for gynecological examination (general) (routine) without abnormal findings: Secondary | ICD-10-CM | POA: Diagnosis not present

## 2017-11-06 DIAGNOSIS — E7800 Pure hypercholesterolemia, unspecified: Secondary | ICD-10-CM | POA: Diagnosis not present

## 2017-11-06 DIAGNOSIS — E039 Hypothyroidism, unspecified: Secondary | ICD-10-CM | POA: Diagnosis not present

## 2017-11-06 DIAGNOSIS — I10 Essential (primary) hypertension: Secondary | ICD-10-CM | POA: Diagnosis not present

## 2017-11-06 DIAGNOSIS — Z1212 Encounter for screening for malignant neoplasm of rectum: Secondary | ICD-10-CM | POA: Diagnosis not present

## 2017-11-07 DIAGNOSIS — H25811 Combined forms of age-related cataract, right eye: Secondary | ICD-10-CM | POA: Diagnosis not present

## 2017-11-07 DIAGNOSIS — H2511 Age-related nuclear cataract, right eye: Secondary | ICD-10-CM | POA: Diagnosis not present

## 2017-11-15 DIAGNOSIS — H2512 Age-related nuclear cataract, left eye: Secondary | ICD-10-CM | POA: Diagnosis not present

## 2017-11-15 DIAGNOSIS — H25012 Cortical age-related cataract, left eye: Secondary | ICD-10-CM | POA: Diagnosis not present

## 2017-12-05 DIAGNOSIS — H2512 Age-related nuclear cataract, left eye: Secondary | ICD-10-CM | POA: Diagnosis not present

## 2017-12-05 DIAGNOSIS — H25812 Combined forms of age-related cataract, left eye: Secondary | ICD-10-CM | POA: Diagnosis not present

## 2018-01-30 DIAGNOSIS — M25562 Pain in left knee: Secondary | ICD-10-CM | POA: Diagnosis not present

## 2018-01-30 DIAGNOSIS — M170 Bilateral primary osteoarthritis of knee: Secondary | ICD-10-CM | POA: Diagnosis not present

## 2018-01-30 DIAGNOSIS — M25462 Effusion, left knee: Secondary | ICD-10-CM | POA: Diagnosis not present

## 2018-01-30 DIAGNOSIS — M7122 Synovial cyst of popliteal space [Baker], left knee: Secondary | ICD-10-CM | POA: Diagnosis not present

## 2018-01-30 DIAGNOSIS — M1990 Unspecified osteoarthritis, unspecified site: Secondary | ICD-10-CM | POA: Diagnosis not present

## 2018-03-12 ENCOUNTER — Other Ambulatory Visit: Payer: Self-pay | Admitting: Rheumatology

## 2018-03-12 DIAGNOSIS — M7122 Synovial cyst of popliteal space [Baker], left knee: Secondary | ICD-10-CM

## 2018-03-18 DIAGNOSIS — M25562 Pain in left knee: Secondary | ICD-10-CM | POA: Diagnosis not present

## 2018-03-20 ENCOUNTER — Other Ambulatory Visit: Payer: Commercial Managed Care - HMO

## 2018-03-23 DIAGNOSIS — M25562 Pain in left knee: Secondary | ICD-10-CM | POA: Diagnosis not present

## 2018-04-10 DIAGNOSIS — S83242A Other tear of medial meniscus, current injury, left knee, initial encounter: Secondary | ICD-10-CM | POA: Diagnosis not present

## 2018-04-10 DIAGNOSIS — M25562 Pain in left knee: Secondary | ICD-10-CM | POA: Diagnosis not present

## 2018-04-10 DIAGNOSIS — M1712 Unilateral primary osteoarthritis, left knee: Secondary | ICD-10-CM | POA: Diagnosis not present

## 2018-04-10 DIAGNOSIS — S83282A Other tear of lateral meniscus, current injury, left knee, initial encounter: Secondary | ICD-10-CM | POA: Diagnosis not present

## 2018-05-02 DIAGNOSIS — E7800 Pure hypercholesterolemia, unspecified: Secondary | ICD-10-CM | POA: Diagnosis not present

## 2018-05-06 DIAGNOSIS — G8918 Other acute postprocedural pain: Secondary | ICD-10-CM | POA: Diagnosis not present

## 2018-05-06 DIAGNOSIS — M23242 Derangement of anterior horn of lateral meniscus due to old tear or injury, left knee: Secondary | ICD-10-CM | POA: Diagnosis not present

## 2018-05-06 DIAGNOSIS — M23342 Other meniscus derangements, anterior horn of lateral meniscus, left knee: Secondary | ICD-10-CM | POA: Diagnosis not present

## 2018-05-06 DIAGNOSIS — M2242 Chondromalacia patellae, left knee: Secondary | ICD-10-CM | POA: Diagnosis not present

## 2018-05-06 DIAGNOSIS — M23322 Other meniscus derangements, posterior horn of medial meniscus, left knee: Secondary | ICD-10-CM | POA: Diagnosis not present

## 2018-05-06 DIAGNOSIS — M23222 Derangement of posterior horn of medial meniscus due to old tear or injury, left knee: Secondary | ICD-10-CM | POA: Diagnosis not present

## 2018-05-06 DIAGNOSIS — M23212 Derangement of anterior horn of medial meniscus due to old tear or injury, left knee: Secondary | ICD-10-CM | POA: Diagnosis not present

## 2018-05-06 DIAGNOSIS — M94262 Chondromalacia, left knee: Secondary | ICD-10-CM | POA: Diagnosis not present

## 2018-05-13 DIAGNOSIS — E039 Hypothyroidism, unspecified: Secondary | ICD-10-CM | POA: Diagnosis not present

## 2018-05-13 DIAGNOSIS — E785 Hyperlipidemia, unspecified: Secondary | ICD-10-CM | POA: Diagnosis not present

## 2018-05-13 DIAGNOSIS — I129 Hypertensive chronic kidney disease with stage 1 through stage 4 chronic kidney disease, or unspecified chronic kidney disease: Secondary | ICD-10-CM | POA: Diagnosis not present

## 2018-06-11 DIAGNOSIS — L519 Erythema multiforme, unspecified: Secondary | ICD-10-CM | POA: Diagnosis not present

## 2018-07-02 DIAGNOSIS — Z961 Presence of intraocular lens: Secondary | ICD-10-CM | POA: Diagnosis not present

## 2018-07-02 DIAGNOSIS — H40013 Open angle with borderline findings, low risk, bilateral: Secondary | ICD-10-CM | POA: Diagnosis not present

## 2018-07-02 DIAGNOSIS — H01003 Unspecified blepharitis right eye, unspecified eyelid: Secondary | ICD-10-CM | POA: Diagnosis not present

## 2018-07-02 DIAGNOSIS — H04123 Dry eye syndrome of bilateral lacrimal glands: Secondary | ICD-10-CM | POA: Diagnosis not present

## 2018-07-25 DIAGNOSIS — M25562 Pain in left knee: Secondary | ICD-10-CM | POA: Diagnosis not present

## 2018-07-25 DIAGNOSIS — M1712 Unilateral primary osteoarthritis, left knee: Secondary | ICD-10-CM | POA: Diagnosis not present

## 2018-09-10 DIAGNOSIS — Z1231 Encounter for screening mammogram for malignant neoplasm of breast: Secondary | ICD-10-CM | POA: Diagnosis not present

## 2018-09-10 DIAGNOSIS — Z803 Family history of malignant neoplasm of breast: Secondary | ICD-10-CM | POA: Diagnosis not present

## 2018-10-15 DIAGNOSIS — H671 Otitis media in diseases classified elsewhere, right ear: Secondary | ICD-10-CM | POA: Diagnosis not present

## 2018-10-15 DIAGNOSIS — J019 Acute sinusitis, unspecified: Secondary | ICD-10-CM | POA: Diagnosis not present

## 2018-10-15 MED FILL — AMOX-CLAV 500-125 MG TABLET: 500-125 | 10 days supply | Qty: 20 | Fill #0

## 2018-11-06 DIAGNOSIS — Z0000 Encounter for general adult medical examination without abnormal findings: Secondary | ICD-10-CM | POA: Diagnosis not present

## 2018-11-06 DIAGNOSIS — E559 Vitamin D deficiency, unspecified: Secondary | ICD-10-CM | POA: Diagnosis not present

## 2018-11-06 DIAGNOSIS — E785 Hyperlipidemia, unspecified: Secondary | ICD-10-CM | POA: Diagnosis not present

## 2018-11-06 DIAGNOSIS — E039 Hypothyroidism, unspecified: Secondary | ICD-10-CM | POA: Diagnosis not present

## 2018-11-11 DIAGNOSIS — Z1212 Encounter for screening for malignant neoplasm of rectum: Secondary | ICD-10-CM | POA: Diagnosis not present

## 2018-11-11 DIAGNOSIS — E7800 Pure hypercholesterolemia, unspecified: Secondary | ICD-10-CM | POA: Diagnosis not present

## 2018-11-11 DIAGNOSIS — I10 Essential (primary) hypertension: Secondary | ICD-10-CM | POA: Diagnosis not present

## 2018-11-11 DIAGNOSIS — E039 Hypothyroidism, unspecified: Secondary | ICD-10-CM | POA: Diagnosis not present

## 2018-11-11 DIAGNOSIS — Z01419 Encounter for gynecological examination (general) (routine) without abnormal findings: Secondary | ICD-10-CM | POA: Diagnosis not present

## 2018-11-11 DIAGNOSIS — Z0000 Encounter for general adult medical examination without abnormal findings: Secondary | ICD-10-CM | POA: Diagnosis not present

## 2018-11-18 DIAGNOSIS — M1712 Unilateral primary osteoarthritis, left knee: Secondary | ICD-10-CM | POA: Diagnosis not present

## 2018-11-18 DIAGNOSIS — M25562 Pain in left knee: Secondary | ICD-10-CM | POA: Diagnosis not present

## 2018-12-30 DIAGNOSIS — M25562 Pain in left knee: Secondary | ICD-10-CM | POA: Diagnosis not present

## 2018-12-30 DIAGNOSIS — M1712 Unilateral primary osteoarthritis, left knee: Secondary | ICD-10-CM | POA: Diagnosis not present

## 2019-01-07 DIAGNOSIS — M25562 Pain in left knee: Secondary | ICD-10-CM | POA: Diagnosis not present

## 2019-01-13 DIAGNOSIS — M25562 Pain in left knee: Secondary | ICD-10-CM | POA: Diagnosis not present

## 2019-01-15 DIAGNOSIS — M25562 Pain in left knee: Secondary | ICD-10-CM | POA: Diagnosis not present

## 2019-01-20 DIAGNOSIS — M25562 Pain in left knee: Secondary | ICD-10-CM | POA: Diagnosis not present

## 2019-01-23 DIAGNOSIS — M25562 Pain in left knee: Secondary | ICD-10-CM | POA: Diagnosis not present

## 2019-01-27 DIAGNOSIS — M25562 Pain in left knee: Secondary | ICD-10-CM | POA: Diagnosis not present

## 2019-01-29 DIAGNOSIS — M25562 Pain in left knee: Secondary | ICD-10-CM | POA: Diagnosis not present

## 2019-05-07 DIAGNOSIS — E039 Hypothyroidism, unspecified: Secondary | ICD-10-CM | POA: Diagnosis not present

## 2019-05-07 DIAGNOSIS — I10 Essential (primary) hypertension: Secondary | ICD-10-CM | POA: Diagnosis not present

## 2019-05-07 DIAGNOSIS — E7800 Pure hypercholesterolemia, unspecified: Secondary | ICD-10-CM | POA: Diagnosis not present

## 2019-05-12 DIAGNOSIS — E039 Hypothyroidism, unspecified: Secondary | ICD-10-CM | POA: Diagnosis not present

## 2019-05-12 DIAGNOSIS — E785 Hyperlipidemia, unspecified: Secondary | ICD-10-CM | POA: Diagnosis not present

## 2019-05-12 DIAGNOSIS — I129 Hypertensive chronic kidney disease with stage 1 through stage 4 chronic kidney disease, or unspecified chronic kidney disease: Secondary | ICD-10-CM | POA: Diagnosis not present

## 2019-07-07 DIAGNOSIS — H04123 Dry eye syndrome of bilateral lacrimal glands: Secondary | ICD-10-CM | POA: Diagnosis not present

## 2019-07-07 DIAGNOSIS — H0102A Squamous blepharitis right eye, upper and lower eyelids: Secondary | ICD-10-CM | POA: Diagnosis not present

## 2019-07-07 DIAGNOSIS — Z961 Presence of intraocular lens: Secondary | ICD-10-CM | POA: Diagnosis not present

## 2019-07-07 DIAGNOSIS — H40013 Open angle with borderline findings, low risk, bilateral: Secondary | ICD-10-CM | POA: Diagnosis not present

## 2019-07-16 DIAGNOSIS — M1712 Unilateral primary osteoarthritis, left knee: Secondary | ICD-10-CM | POA: Diagnosis not present

## 2019-07-16 DIAGNOSIS — M25562 Pain in left knee: Secondary | ICD-10-CM | POA: Diagnosis not present

## 2019-08-05 DIAGNOSIS — Z8616 Personal history of COVID-19: Secondary | ICD-10-CM

## 2019-08-05 HISTORY — DX: Personal history of COVID-19: Z86.16

## 2019-09-01 ENCOUNTER — Encounter (HOSPITAL_COMMUNITY): Payer: Self-pay

## 2019-09-01 ENCOUNTER — Ambulatory Visit (HOSPITAL_COMMUNITY)
Admission: EM | Admit: 2019-09-01 | Discharge: 2019-09-01 | Disposition: A | Discharge status: Home / Self Care | Payer: PRIVATE HEALTH INSURANCE | Attending: Family Medicine | Admitting: Family Medicine

## 2019-09-01 ENCOUNTER — Other Ambulatory Visit: Payer: Self-pay

## 2019-09-01 DIAGNOSIS — Z885 Allergy status to narcotic agent status: Secondary | ICD-10-CM | POA: Diagnosis not present

## 2019-09-01 DIAGNOSIS — U071 COVID-19: Secondary | ICD-10-CM | POA: Diagnosis not present

## 2019-09-01 DIAGNOSIS — Z8249 Family history of ischemic heart disease and other diseases of the circulatory system: Secondary | ICD-10-CM | POA: Insufficient documentation

## 2019-09-01 DIAGNOSIS — Z0189 Encounter for other specified special examinations: Secondary | ICD-10-CM

## 2019-09-01 DIAGNOSIS — M797 Fibromyalgia: Secondary | ICD-10-CM | POA: Insufficient documentation

## 2019-09-01 DIAGNOSIS — N3090 Cystitis, unspecified without hematuria: Secondary | ICD-10-CM | POA: Insufficient documentation

## 2019-09-01 DIAGNOSIS — I10 Essential (primary) hypertension: Secondary | ICD-10-CM | POA: Diagnosis not present

## 2019-09-01 DIAGNOSIS — R350 Frequency of micturition: Secondary | ICD-10-CM | POA: Diagnosis not present

## 2019-09-01 DIAGNOSIS — Z803 Family history of malignant neoplasm of breast: Secondary | ICD-10-CM | POA: Insufficient documentation

## 2019-09-01 DIAGNOSIS — Z8541 Personal history of malignant neoplasm of cervix uteri: Secondary | ICD-10-CM | POA: Diagnosis not present

## 2019-09-01 DIAGNOSIS — Z808 Family history of malignant neoplasm of other organs or systems: Secondary | ICD-10-CM | POA: Insufficient documentation

## 2019-09-01 DIAGNOSIS — M1990 Unspecified osteoarthritis, unspecified site: Secondary | ICD-10-CM | POA: Insufficient documentation

## 2019-09-01 LAB — POCT URINALYSIS DIP (DEVICE)
Glucose, UA: NEGATIVE mg/dL
Hgb urine dipstick: NEGATIVE
Ketones, ur: NEGATIVE mg/dL
Leukocytes,Ua: NEGATIVE
Nitrite: NEGATIVE
Protein, ur: NEGATIVE mg/dL
Specific Gravity, Urine: >=1.03 (ref 1.005–1.030)
Urobilinogen, UA: 0.2 mg/dL (ref 0.0–1.0)
pH: 5.5 (ref 5.0–8.0)

## 2019-09-01 MED ORDER — CEPHALEXIN 500 MG PO CAPS: 500.0000 mg | ORAL_CAPSULE | Freq: Two times a day (BID) | ORAL | 0 refills | Status: DC

## 2019-09-01 NOTE — ED Triage Notes (Signed)
Patient presents to Urgent Care with complaints of possible UTI since 5 days ago. Patient reports she also has a runny nose and her PCP wanted her to be tested for COVID.

## 2019-09-01 NOTE — Discharge Instructions (Signed)
If your Covid-19 test is positive, you will get a phone call from [facility] regarding your results. If your Covid-19 test is negative, you will NOT get a phone call from  with your results. You may view your results on MyChart. If you do not have a MyChart account, sign up instructions are in your discharge papers. ° °

## 2019-09-02 LAB — URINE CULTURE

## 2019-09-03 LAB — NOVEL CORONAVIRUS, NAA (HOSP ORDER, SEND-OUT TO REF LAB; TAT 18-24 HRS): SARS-CoV-2, NAA: DETECTED — AB

## 2019-09-03 NOTE — ED Provider Notes (Signed)
[facility]    ASSESSMENT & PLAN:  1. Urinary frequency   2. Cystitis   3. Essential hypertension   4. Patient request for diagnostic testing     To begin: Meds ordered this encounter  Medications  . cephALEXin (KEFLEX) 500 MG capsule    Sig: Take 1 capsule (500 mg total) by mouth 2 (two) times daily.    Dispense:  10 capsule    Refill:  0    No signs of pyelonephritis. Discussed. Urine culture sent. Will notify patient when results available. Will follow up with her PCP or here if not showing improvement over the next 48 hours, sooner if needed. May recheck BP here at anytime. Continue current medications.  COVID-19 testing sent at her request. To self-quarantine until results are available.  Outlined signs and symptoms indicating need for more acute intervention. Patient verbalized understanding. After Visit Summary given.  SUBJECTIVE:  Susan Webster is a 73 y.o. female who complains of urinary frequency, urgency and dysuria for the past 4-5 days. Without associated flank pain, fever, chills, vaginal discharge or bleeding. Gross hematuria: not present. No specific aggravating or alleviating factors reported. No LE edema. Normal PO intake without n/v/d. Without specific abdominal pain. Ambulatory without difficulty. OTC treatment: none. H/O UTIs 'that feel the same'.  LMP: No LMP recorded. Patient has had a hysterectomy.  Increased blood pressure noted today. Reports that she is taking medications for BP.  She reports no chest pain on exertion, no dyspnea on exertion, no swelling of ankles, no orthostatic dizziness or lightheadedness, no orthopnea or paroxysmal nocturnal dyspnea, no palpitations and no intermittent claudication symptoms.  Also requests COVID-19 testing. Noticed a slight runny nose this morning. No respiratory symptoms. No cough or SOB.  ROS: As in HPI. All other systems negative.   OBJECTIVE:  Vitals:   09/01/19 1354  BP: (!) 150/81   Pulse: 97  Resp: 16  Temp: 100.1 F (37.8 C)  TempSrc: Oral  SpO2: 99%   General appearance: alert; no distress HENT: oropharynx: moist Lungs: unlabored respirations; no coughing Abdomen: soft, non-tender; bowel sounds normal; no guarding or rebound tenderness Back: no CVA tenderness Extremities: no edema; symmetrical with no gross deformities Skin: warm and dry Neurologic: normal gait Psychological: alert and cooperative; normal mood and affect  Labs Reviewed  NOVEL CORONAVIRUS, NAA (HOSP ORDER, SEND-OUT TO REF LAB; TAT 18-24 HRS) -   URINE CULTURE - Abnormal; Notable for the following components:   Culture MULTIPLE SPECIES PRESENT, SUGGEST RECOLLECTION (*)    All other components within normal limits  POCT URINALYSIS DIP (DEVICE) - Abnormal; Notable for the following components:   Bilirubin Urine SMALL (*)    All other components within normal limits    Allergies  Allergen Reactions  . Vicodin [Hydrocodone-Acetaminophen] Itching and Rash  . Codeine Nausea And Vomiting and Rash    headache    Past Medical History:  Diagnosis Date  . Arthritis   . FHx: BRCA2 gene positive    BRCA2 mutation in sister  . Fibromyalgia   . History of cervical cancer   . Hypertension   . Hypothyroid   . Thyroid disease    Social History   Socioeconomic History  . Marital status: Divorced    Spouse name: Not on file  . Number of children: Not on file  . Years of education: Not on file  . Highest education level: Not on file  Occupational History  . Not on file  Social Needs  .  Financial resource strain: Not on file  . Food insecurity    Worry: Not on file    Inability: Not on file  . Transportation needs    Medical: Not on file    Non-medical: Not on file  Tobacco Use  . Smoking status: Never Smoker  . Smokeless tobacco: Never Used  Substance and Sexual Activity  . Alcohol use: Yes    Comment: seldom  . Drug use: No  . Sexual activity: Not on file  Lifestyle  .  Physical activity    Days per week: Not on file    Minutes per session: Not on file  . Stress: Not on file  Relationships  . Social Herbalist on phone: Not on file    Gets together: Not on file    Attends religious service: Not on file    Active member of club or organization: Not on file    Attends meetings of clubs or organizations: Not on file    Relationship status: Not on file  . Intimate partner violence    Fear of current or ex partner: Not on file    Emotionally abused: Not on file    Physically abused: Not on file    Forced sexual activity: Not on file  Other Topics Concern  . Not on file  Social History Narrative  . Not on file   Family History  Problem Relation Age of Onset  . Breast cancer Sister 63       Recurrence at 33; BRCA2 mutation  . Cancer Maternal Uncle        head/neck ca 60s  . Cancer Maternal Grandmother        possible stomach cancer; deceased 76s  . Breast cancer Sister 56       Currently 41; genetic testing pending  . Breast cancer Other        pat grandmother's sister  . Healthy Mother        Vanessa Kick, MD 09/03/19 1001

## 2019-09-04 ENCOUNTER — Telehealth (HOSPITAL_COMMUNITY): Payer: Self-pay | Admitting: Emergency Medicine

## 2019-09-04 NOTE — Telephone Encounter (Signed)
Patient contacted and made aware of  Positive covid  results, all questions answered

## 2019-09-10 DIAGNOSIS — Z8619 Personal history of other infectious and parasitic diseases: Secondary | ICD-10-CM | POA: Diagnosis not present

## 2019-09-12 ENCOUNTER — Other Ambulatory Visit: Payer: Self-pay

## 2019-09-12 DIAGNOSIS — Z20822 Contact with and (suspected) exposure to covid-19: Secondary | ICD-10-CM

## 2019-09-12 DIAGNOSIS — Z20828 Contact with and (suspected) exposure to other viral communicable diseases: Secondary | ICD-10-CM | POA: Diagnosis not present

## 2019-09-13 LAB — NOVEL CORONAVIRUS, NAA: SARS-CoV-2, NAA: DETECTED — AB

## 2019-09-16 ENCOUNTER — Ambulatory Visit: Payer: Self-pay

## 2019-09-16 NOTE — Telephone Encounter (Signed)
Provided Covid -19  Results  and care advice. Voiced understanding.

## 2019-11-13 DIAGNOSIS — I129 Hypertensive chronic kidney disease with stage 1 through stage 4 chronic kidney disease, or unspecified chronic kidney disease: Secondary | ICD-10-CM | POA: Diagnosis not present

## 2019-11-13 DIAGNOSIS — M8580 Other specified disorders of bone density and structure, unspecified site: Secondary | ICD-10-CM | POA: Diagnosis not present

## 2019-11-13 DIAGNOSIS — E559 Vitamin D deficiency, unspecified: Secondary | ICD-10-CM | POA: Diagnosis not present

## 2019-11-13 DIAGNOSIS — N183 Chronic kidney disease, stage 3 unspecified: Secondary | ICD-10-CM | POA: Diagnosis not present

## 2019-11-13 DIAGNOSIS — E039 Hypothyroidism, unspecified: Secondary | ICD-10-CM | POA: Diagnosis not present

## 2019-11-13 DIAGNOSIS — E538 Deficiency of other specified B group vitamins: Secondary | ICD-10-CM | POA: Diagnosis not present

## 2019-11-13 DIAGNOSIS — E785 Hyperlipidemia, unspecified: Secondary | ICD-10-CM | POA: Diagnosis not present

## 2019-11-20 DIAGNOSIS — E785 Hyperlipidemia, unspecified: Secondary | ICD-10-CM | POA: Diagnosis not present

## 2019-11-20 DIAGNOSIS — I10 Essential (primary) hypertension: Secondary | ICD-10-CM | POA: Diagnosis not present

## 2019-11-20 DIAGNOSIS — Z0000 Encounter for general adult medical examination without abnormal findings: Secondary | ICD-10-CM | POA: Diagnosis not present

## 2019-11-20 DIAGNOSIS — E039 Hypothyroidism, unspecified: Secondary | ICD-10-CM | POA: Diagnosis not present

## 2019-12-19 DIAGNOSIS — M1711 Unilateral primary osteoarthritis, right knee: Secondary | ICD-10-CM | POA: Diagnosis not present

## 2019-12-19 DIAGNOSIS — M1712 Unilateral primary osteoarthritis, left knee: Secondary | ICD-10-CM | POA: Diagnosis not present

## 2019-12-19 DIAGNOSIS — M25562 Pain in left knee: Secondary | ICD-10-CM | POA: Diagnosis not present

## 2019-12-19 DIAGNOSIS — M170 Bilateral primary osteoarthritis of knee: Secondary | ICD-10-CM | POA: Diagnosis not present

## 2020-01-05 DIAGNOSIS — Z01818 Encounter for other preprocedural examination: Secondary | ICD-10-CM | POA: Diagnosis not present

## 2020-01-05 DIAGNOSIS — M1712 Unilateral primary osteoarthritis, left knee: Secondary | ICD-10-CM | POA: Diagnosis not present

## 2020-01-08 NOTE — H&P (Signed)
TOTAL KNEE ADMISSION H&P  Patient is being admitted for left total knee arthroplasty.  Subjective:  Chief Complaint:  Left knee primary OA / pain  HPI: Susan Webster, 74 y.o. female, has a history of pain and functional disability in the left knee due to arthritis and has failed non-surgical conservative treatments for greater than 12 weeks to includeNSAID's and/or analgesics, corticosteriod injections, activity modification and aspiraton.  Onset of symptoms was gradual, starting >10 years ago with gradually worsening course since that time. The patient noted prior procedures on the knee to include  arthroscopy on the left knee(s).  Patient currently rates pain in the left knee(s) at 10 out of 10 with activity. Patient has night pain, worsening of pain with activity and weight bearing, pain that interferes with activities of daily living, pain with passive range of motion, crepitus and joint swelling.  Patient has evidence of periarticular osteophytes and joint space narrowing by imaging studies. There is no active infection.  Risks, benefits and expectations were discussed with the patient.  Risks including but not limited to the risk of anesthesia, blood clots, nerve damage, blood vessel damage, failure of the prosthesis, infection and up to and including death.  Patient understand the risks, benefits and expectations and wishes to proceed with surgery.   PCP: Thressa Sheller, MD (Inactive)  D/C Plans:       Home (same day)  Post-op Meds:       No Rx given   Tranexamic Acid:      To be given - IV   Decadron:      Is to be given  FYI:     ASA  Tramadol vs Dilaudid (codeine allergy)  DME:   Pt already has equipment  PT:   OPPT   Pharmacy: [facility]      Patient Active Problem List   Diagnosis Date Noted  . FHx: BRCA2 gene positive    Past Medical History:  Diagnosis Date  . Arthritis   . FHx: BRCA2 gene positive    BRCA2 mutation in sister  . Fibromyalgia    . History of cervical cancer   . Hypertension   . Hypothyroid   . Thyroid disease     Past Surgical History:  Procedure Laterality Date  . ABDOMINAL HYSTERECTOMY    . BREAST LUMPECTOMY    . ELBOW SURGERY Left     No current facility-administered medications for this encounter.   Current Outpatient Medications  Medication Sig Dispense Refill Last Dose  . aspirin EC 81 MG tablet Take 81 mg by mouth daily.       Marland Kitchen azithromycin (ZITHROMAX Z-PAK) 250 MG tablet Take as directed on pack 6 tablet 0   . b complex vitamins tablet Take 1 tablet by mouth daily.       . Calcium Carbonate-Vitamin D (CALTRATE 600+D) 600-400 MG-UNIT per tablet Take 1 tablet by mouth daily.       . cephALEXin (KEFLEX) 500 MG capsule Take 1 capsule (500 mg total) by mouth 2 (two) times daily. 10 capsule 0   . cetirizine (ZYRTEC) 10 MG tablet Take 10 mg by mouth daily.       . cholecalciferol (VITAMIN D) 1000 UNITS tablet Take 1,000 Units by mouth daily.       Marland Kitchen dextromethorphan (DELSYM) 30 MG/5ML liquid Take 10 mLs (60 mg total) by mouth 2 (two) times daily. As needed for cough 89 mL 0   . HYDROcodone-acetaminophen (NORCO/VICODIN) 5-325 MG  per tablet Take 1-2 tablets by mouth every 6 (six) hours as needed for moderate pain. 20 tablet 0   . levothyroxine (SYNTHROID, LEVOTHROID) 75 MCG tablet Take 75 mcg by mouth daily.       Marland Kitchen lisinopril-hydrochlorothiazide (PRINZIDE,ZESTORETIC) 20-25 MG per tablet Take 1 tablet by mouth daily.       . magnesium oxide (MAG-OX) 400 MG tablet Take 400 mg by mouth daily.       . Multiple Vitamin (MULTIVITAMIN) tablet Take 1 tablet by mouth daily.       . pantoprazole (PROTONIX) 40 MG tablet Take 40 mg by mouth daily.       Marland Kitchen SIMVASTATIN PO Take 1 tablet by mouth daily.        Allergies  Allergen Reactions  . Vicodin [Hydrocodone-Acetaminophen] Itching and Rash  . Codeine Nausea And Vomiting and Rash    headache    Social History   Tobacco Use  . Smoking status: Never Smoker  .  Smokeless tobacco: Never Used  Substance Use Topics  . Alcohol use: Yes    Comment: seldom    Family History  Problem Relation Age of Onset  . Breast cancer Sister 58       Recurrence at 73; BRCA2 mutation  . Cancer Maternal Uncle        head/neck ca 60s  . Cancer Maternal Grandmother        possible stomach cancer; deceased 69s  . Breast cancer Sister 51       Currently 82; genetic testing pending  . Breast cancer Other        pat grandmother's sister  . Healthy Mother      Review of Systems  Constitutional: Negative.   HENT: Negative.   Eyes: Negative.   Respiratory: Negative.   Cardiovascular: Negative.   Gastrointestinal: Negative.   Genitourinary: Negative.   Musculoskeletal: Positive for joint pain.  Skin: Negative.   Neurological: Negative.   Endo/Heme/Allergies: Negative.   Psychiatric/Behavioral: Negative.       Objective:  Physical Exam  Constitutional: She is oriented to person, place, and time. She appears well-developed.  HENT:  Head: Normocephalic.  Eyes: Pupils are equal, round, and reactive to light.  Neck: No JVD present. No tracheal deviation present. No thyromegaly present.  Cardiovascular: Normal rate, regular rhythm and intact distal pulses.  Murmur heard. Respiratory: Effort normal and breath sounds normal. No respiratory distress. She has no wheezes.  GI: Soft. There is no abdominal tenderness. There is no guarding.  Musculoskeletal:     Cervical back: Neck supple.     Left knee: Swelling and bony tenderness present. No deformity, erythema, ecchymosis or lacerations. Decreased range of motion. Tenderness present.  Lymphadenopathy:    She has no cervical adenopathy.  Neurological: She is alert and oriented to person, place, and time.  Skin: Skin is warm and dry.  Psychiatric: She has a normal mood and affect.      Labs:  Estimated body mass index is 31.21 kg/m as calculated from the following:   Height as of 03/07/14: 5' 3.5" (1.613  m).   Weight as of 03/07/14: 81.2 kg.   Imaging Review Plain radiographs demonstrate severe degenerative joint disease of the left knee. The bone quality appears to be good for age and reported activity level.      Assessment/Plan:  End stage arthritis, left knee   The patient history, physical examination, clinical judgment of the provider and imaging studies are consistent with end stage degenerative  joint disease of the left knee(s) and total knee arthroplasty is deemed medically necessary. The treatment options including medical management, injection therapy arthroscopy and arthroplasty were discussed at length. The risks and benefits of total knee arthroplasty were presented and reviewed. The risks due to aseptic loosening, infection, stiffness, patella tracking problems, thromboembolic complications and other imponderables were discussed. The patient acknowledged the explanation, agreed to proceed with the plan and consent was signed. Patient is being admitted for inpatient treatment for surgery, pain control, PT, OT, prophylactic antibiotics, VTE prophylaxis, progressive ambulation and ADL's and discharge planning. The patient is planning to be discharged home.     Patient's anticipated LOS is less than 2 midnights, meeting these requirements: - Lives within 1 hour of care - Has a competent adult at home to recover with post-op recover - NO history of  - Chronic pain requiring opiods  - Diabetes  - Coronary Artery Disease  - Heart failure  - Heart attack  - Stroke  - DVT/VTE  - Cardiac arrhythmia  - Respiratory Failure/COPD  - Renal failure  - Anemia  - Advanced Liver disease    [place] Pugh. Rhiana Morash   PA-C  01/08/2020, 4:21 PM

## 2020-01-14 ENCOUNTER — Encounter (HOSPITAL_COMMUNITY): Payer: Self-pay

## 2020-01-14 NOTE — Patient Instructions (Addendum)
DUE TO COVID-19 ONLY ONE VISITOR IS ALLOWED TO COME WITH YOU AND STAY IN THE WAITING ROOM ONLY DURING PRE OP AND PROCEDURE. THE ONE VISITOR MAY VISIT WITH YOU IN YOUR PRIVATE ROOM DURING VISITING HOURS ONLY!!   COVID SWAB TESTING MUST BE COMPLETED ON:   Monday, Feb. 15, 2021 at 12:20PM   [address] [place] [facility] pre surgical testing line (Must self quarantine after testing. Follow instructions on handout.)             Your procedure is scheduled on: Thursday, Feb. 18, 2021   Report to [facility] Main  Entrance   Report to Admitting at 6:45 AM   Call this number if you have problems the morning of surgery [phone]   Do not eat food:After Midnight.   May have liquids until 6:15 AM day of surgery   CLEAR LIQUID DIET  Foods Allowed                                                                     Foods Excluded  Water, Black Coffee and tea, regular and decaf                             liquids that you cannot  Plain Jell-O in any flavor  (No red)                                           see through such as: Fruit ices (not with fruit pulp)                                     milk, soups, orange juice  Iced Popsicles (No red)                                    All solid food Carbonated beverages, regular and diet                                    Apple juices Sports drinks like Gatorade (No red) Lightly seasoned clear broth or consume(fat free) Sugar, honey syrup  Sample Menu Breakfast                                Lunch                                     Supper Cranberry juice                    Beef broth                            Chicken broth Jell-O  Grape juice                           Apple juice Coffee or tea                        Jell-O                                      Popsicle                                                Coffee or tea                        Coffee or  tea   Complete one Ensure drink the morning of surgery at 6:15AM the day of surgery.   Oral Hygiene is also important to reduce your risk of infection.                                    Remember - BRUSH YOUR TEETH THE MORNING OF SURGERY WITH YOUR REGULAR TOOTHPASTE   Do NOT smoke after Midnight   Take these medicines the morning of surgery with A SIP OF WATER: Levothyroxine                               You may not have any metal on your body including hair pins, jewelry, and body piercings             Do not wear make-up, lotions, powders, perfumes/cologne, or deodorant             Do not wear nail polish.  Do not shave  48 hours prior to surgery.             Do not bring valuables to the hospital. [place].   Contacts, dentures or bridgework may not be worn into surgery.    Patients discharged the day of surgery will not be allowed to drive home.   Special Instructions: Bring a copy of your healthcare power of attorney and living will documents         the day of surgery if you haven't scanned them in before.              Please read over the following fact sheets you were given:  [facility] - Preparing for Surgery Before surgery, you can play an important role.  Because skin is not sterile, your skin needs to be as free of germs as possible.  You can reduce the number of germs on your skin by washing with CHG (chlorahexidine gluconate) soap before surgery.  CHG is an antiseptic cleaner which kills germs and bonds with the skin to continue killing germs even after washing. Please DO NOT use if you have an allergy to CHG or antibacterial soaps.  If your skin becomes reddened/irritated stop using the CHG and inform your nurse when you arrive at Short Stay. Do  not shave (including legs and underarms) for at least 48 hours prior to the first CHG shower.  You may shave your face/neck.  Please follow these instructions carefully:  1.   Shower with CHG Soap the night before surgery and the  morning of surgery.  2.  If you choose to wash your hair, wash your hair first as usual with your normal  shampoo.  3.  After you shampoo, rinse your hair and body thoroughly to remove the shampoo.                             4.  Use CHG as you would any other liquid soap.  You can apply chg directly to the skin and wash.  Gently with a scrungie or clean washcloth.  5.  Apply the CHG Soap to your body ONLY FROM THE NECK DOWN.   Do   not use on face/ open                           Wound or open sores. Avoid contact with eyes, ears mouth and   genitals (private parts).                       Wash face,  Genitals (private parts) with your normal soap.             6.  Wash thoroughly, paying special attention to the area where your    surgery  will be performed.  7.  Thoroughly rinse your body with warm water from the neck down.  8.  DO NOT shower/wash with your normal soap after using and rinsing off the CHG Soap.                9.  Pat yourself dry with a clean towel.            10.  Wear clean pajamas.            11.  Place clean sheets on your bed the night of your first shower and do not  sleep with pets. Day of Surgery : Do not apply any lotions/deodorants the morning of surgery.  Please wear clean clothes to the hospital/surgery center.  FAILURE TO FOLLOW THESE INSTRUCTIONS MAY RESULT IN THE CANCELLATION OF YOUR SURGERY  PATIENT SIGNATURE_________________________________  NURSE SIGNATURE__________________________________  ________________________________________________________________________   Susan Webster  An incentive spirometer is a tool that can help keep your lungs clear and active. This tool measures how well you are filling your lungs with each breath. Taking long deep breaths may help reverse or decrease the chance of developing breathing (pulmonary) problems (especially infection) following:  A long period of  time when you are unable to move or be active. BEFORE THE PROCEDURE   If the spirometer includes an indicator to show your best effort, your nurse or respiratory therapist will set it to a desired goal.  If possible, sit up straight or lean slightly forward. Try not to slouch.  Hold the incentive spirometer in an upright position. INSTRUCTIONS FOR USE  1. Sit on the edge of your bed if possible, or sit up as far as you can in bed or on a chair. 2. Hold the incentive spirometer in an upright position. 3. Breathe out normally. 4. Place the mouthpiece in your mouth and seal your lips tightly around it. 5. Breathe in slowly and  as deeply as possible, raising the piston or the ball toward the top of the column. 6. Hold your breath for 3-5 seconds or for as long as possible. Allow the piston or ball to fall to the bottom of the column. 7. Remove the mouthpiece from your mouth and breathe out normally. 8. Rest for a few seconds and repeat Steps 1 through 7 at least 10 times every 1-2 hours when you are awake. Take your time and take a few normal breaths between deep breaths. 9. The spirometer may include an indicator to show your best effort. Use the indicator as a goal to work toward during each repetition. 10. After each set of 10 deep breaths, practice coughing to be sure your lungs are clear. If you have an incision (the cut made at the time of surgery), support your incision when coughing by placing a pillow or rolled up towels firmly against it. Once you are able to get out of bed, walk around indoors and cough well. You may stop using the incentive spirometer when instructed by your caregiver.  RISKS AND COMPLICATIONS  Take your time so you do not get dizzy or light-headed.  If you are in pain, you may need to take or ask for pain medication before doing incentive spirometry. It is harder to take a deep breath if you are having pain. AFTER USE  Rest and breathe slowly and easily.  It can  be helpful to keep track of a log of your progress. Your caregiver can provide you with a simple table to help with this. If you are using the spirometer at home, follow these instructions: [place] IF:   You are having difficultly using the spirometer.  You have trouble using the spirometer as often as instructed.  Your pain medication is not giving enough relief while using the spirometer.  You develop fever of 100.5 F (38.1 C) or higher. SEEK IMMEDIATE MEDICAL CARE IF:   You cough up bloody sputum that had not been present before.  You develop fever of 102 F (38.9 C) or greater.  You develop worsening pain at or near the incision site. MAKE SURE YOU:   Understand these instructions.  Will watch your condition.  Will get help right away if you are not doing well or get worse. Document Released: 04/02/2007 Document Revised: 02/12/2012 Document Reviewed: 06/03/2007 ExitCare Patient Information 2014 ExitCare, [place].   ________________________________________________________________________  WHAT IS A BLOOD TRANSFUSION? Blood Transfusion Information  A transfusion is the replacement of blood or some of its parts. Blood is made up of multiple cells which provide different functions.  Red blood cells carry oxygen and are used for blood loss replacement.  White blood cells fight against infection.  Platelets control bleeding.  Plasma helps clot blood.  Other blood products are available for specialized needs, such as hemophilia or other clotting disorders. BEFORE THE TRANSFUSION  Who gives blood for transfusions?   Healthy volunteers who are fully evaluated to make sure their blood is safe. This is blood bank blood. Transfusion therapy is the safest it has ever been in the practice of medicine. Before blood is taken from a donor, a complete history is taken to make sure that person has no history of diseases nor engages in risky social behavior (examples are  intravenous drug use or sexual activity with multiple partners). The donor's travel history is screened to minimize risk of transmitting infections, such as malaria. The donated blood is tested for signs of  infectious diseases, such as HIV and hepatitis. The blood is then tested to be sure it is compatible with you in order to minimize the chance of a transfusion reaction. If you or a relative donates blood, this is often done in anticipation of surgery and is not appropriate for emergency situations. It takes many days to process the donated blood. RISKS AND COMPLICATIONS Although transfusion therapy is very safe and saves many lives, the main dangers of transfusion include:   Getting an infectious disease.  Developing a transfusion reaction. This is an allergic reaction to something in the blood you were given. Every precaution is taken to prevent this. The decision to have a blood transfusion has been considered carefully by your caregiver before blood is given. Blood is not given unless the benefits outweigh the risks. AFTER THE TRANSFUSION  Right after receiving a blood transfusion, you will usually feel much better and more energetic. This is especially true if your red blood cells have gotten low (anemic). The transfusion raises the level of the red blood cells which carry oxygen, and this usually causes an energy increase.  The nurse administering the transfusion will monitor you carefully for complications. HOME CARE INSTRUCTIONS  No special instructions are needed after a transfusion. You may find your energy is better. Speak with your caregiver about any limitations on activity for underlying diseases you may have. SEEK MEDICAL CARE IF:   Your condition is not improving after your transfusion.  You develop redness or irritation at the intravenous (IV) site. SEEK IMMEDIATE MEDICAL CARE IF:  Any of the following symptoms occur over the next 12 hours:  Shaking chills.  You have a  temperature by mouth above 102 F (38.9 C), not controlled by medicine.  Chest, back, or muscle pain.  People around you feel you are not acting correctly or are confused.  Shortness of breath or difficulty breathing.  Dizziness and fainting.  You get a rash or develop hives.  You have a decrease in urine output.  Your urine turns a dark color or changes to pink, red, or brown. Any of the following symptoms occur over the next 10 days:  You have a temperature by mouth above 102 F (38.9 C), not controlled by medicine.  Shortness of breath.  Weakness after normal activity.  The white part of the eye turns yellow (jaundice).  You have a decrease in the amount of urine or are urinating less often.  Your urine turns a dark color or changes to pink, red, or brown. Document Released: 11/17/2000 Document Revised: 02/12/2012 Document Reviewed: 07/06/2008 [facility] Patient Information 2014 [place], [place].  _______________________________________________________________________

## 2020-01-14 NOTE — Progress Notes (Signed)
Left voicemail with Judeen Hammans to request orders for epic.

## 2020-01-19 ENCOUNTER — Encounter (HOSPITAL_COMMUNITY): Payer: Self-pay

## 2020-01-19 ENCOUNTER — Other Ambulatory Visit (HOSPITAL_COMMUNITY)
Admission: RE | Admit: 2020-01-19 | Discharge: 2020-01-19 | Disposition: A | Discharge status: Home / Self Care | Payer: Medicare HMO | Source: Ambulatory Visit | Attending: Orthopedic Surgery | Admitting: Orthopedic Surgery

## 2020-01-19 ENCOUNTER — Other Ambulatory Visit: Payer: Self-pay

## 2020-01-19 ENCOUNTER — Encounter (HOSPITAL_COMMUNITY)
Admission: RE | Admit: 2020-01-19 | Discharge: 2020-01-19 | Disposition: A | Discharge status: Home / Self Care | Payer: Medicare HMO | Source: Ambulatory Visit | Attending: Orthopedic Surgery | Admitting: Orthopedic Surgery

## 2020-01-19 DIAGNOSIS — Z01812 Encounter for preprocedural laboratory examination: Secondary | ICD-10-CM | POA: Insufficient documentation

## 2020-01-19 DIAGNOSIS — Z7982 Long term (current) use of aspirin: Secondary | ICD-10-CM | POA: Insufficient documentation

## 2020-01-19 DIAGNOSIS — Z79899 Other long term (current) drug therapy: Secondary | ICD-10-CM | POA: Diagnosis not present

## 2020-01-19 DIAGNOSIS — Z20822 Contact with and (suspected) exposure to covid-19: Secondary | ICD-10-CM | POA: Diagnosis not present

## 2020-01-19 DIAGNOSIS — E039 Hypothyroidism, unspecified: Secondary | ICD-10-CM | POA: Diagnosis not present

## 2020-01-19 DIAGNOSIS — Z79890 Hormone replacement therapy (postmenopausal): Secondary | ICD-10-CM | POA: Insufficient documentation

## 2020-01-19 DIAGNOSIS — M170 Bilateral primary osteoarthritis of knee: Secondary | ICD-10-CM | POA: Diagnosis not present

## 2020-01-19 DIAGNOSIS — I10 Essential (primary) hypertension: Secondary | ICD-10-CM | POA: Diagnosis not present

## 2020-01-19 HISTORY — DX: Gastro-esophageal reflux disease without esophagitis: K21.9

## 2020-01-19 HISTORY — DX: Vitamin D deficiency, unspecified: E55.9

## 2020-01-19 HISTORY — DX: Nausea with vomiting, unspecified: R11.2

## 2020-01-19 HISTORY — DX: Other allergic rhinitis: J30.89

## 2020-01-19 HISTORY — DX: Bursopathy, unspecified: M71.9

## 2020-01-19 HISTORY — DX: Hyperlipidemia, unspecified: E78.5

## 2020-01-19 HISTORY — DX: Basal cell carcinoma of skin, unspecified: C44.91

## 2020-01-19 HISTORY — DX: Postpartum depression: F53.0

## 2020-01-19 HISTORY — DX: Personal history of other diseases of the digestive system: Z87.19

## 2020-01-19 HISTORY — DX: Pneumonia, unspecified organism: J18.9

## 2020-01-19 HISTORY — DX: Tinnitus, unspecified ear: H93.19

## 2020-01-19 HISTORY — DX: Personal history of other diseases of the circulatory system: Z86.79

## 2020-01-19 HISTORY — DX: Other complications of anesthesia, initial encounter: T88.59XA

## 2020-01-19 HISTORY — DX: Migraine, unspecified, not intractable, without status migrainosus: G43.909

## 2020-01-19 HISTORY — DX: Other specified postprocedural states: Z98.890

## 2020-01-19 HISTORY — DX: Chronic kidney disease, stage 3 unspecified: N18.30

## 2020-01-19 HISTORY — DX: Unspecified asthma, uncomplicated: J45.909

## 2020-01-19 HISTORY — DX: Dyspnea, unspecified: R06.00

## 2020-01-19 HISTORY — DX: Cardiac murmur, unspecified: R01.1

## 2020-01-19 HISTORY — DX: Unspecified hearing loss, unspecified ear: H91.90

## 2020-01-19 LAB — COMPREHENSIVE METABOLIC PANEL
ALT: 16 U/L (ref 0–44)
AST: 18 U/L (ref 15–41)
Albumin: 4.6 g/dL (ref 3.5–5.0)
Alkaline Phosphatase: 42 U/L (ref 38–126)
Anion gap: 10 (ref 5–15)
BUN: 34 mg/dL — ABNORMAL HIGH (ref 8–23)
CO2: 28 mmol/L (ref 22–32)
Calcium: 9.8 mg/dL (ref 8.9–10.3)
Chloride: 102 mmol/L (ref 98–111)
Creatinine, Ser: 1.07 mg/dL — ABNORMAL HIGH (ref 0.44–1.00)
GFR calc Af Amer: 59 mL/min — ABNORMAL LOW (ref >60)
GFR calc non Af Amer: 51 mL/min — ABNORMAL LOW (ref >60)
Glucose, Bld: 101 mg/dL — ABNORMAL HIGH (ref 70–99)
Potassium: 4.1 mmol/L (ref 3.5–5.1)
Sodium: 140 mmol/L (ref 135–145)
Total Bilirubin: 0.6 mg/dL (ref 0.3–1.2)
Total Protein: 7.6 g/dL (ref 6.5–8.1)

## 2020-01-19 LAB — CBC WITH DIFFERENTIAL/PLATELET
Abs Immature Granulocytes: 0.02 10*3/uL (ref 0.00–0.07)
Basophils Absolute: 0 10*3/uL (ref 0.0–0.1)
Basophils Relative: 1 %
Eosinophils Absolute: 0.1 10*3/uL (ref 0.0–0.5)
Eosinophils Relative: 1 %
HCT: 37.8 % (ref 36.0–46.0)
Hemoglobin: 12.2 g/dL (ref 12.0–15.0)
Immature Granulocytes: 0 %
Lymphocytes Relative: 32 %
Lymphs Abs: 1.5 10*3/uL (ref 0.7–4.0)
MCH: 30.3 pg (ref 26.0–34.0)
MCHC: 32.3 g/dL (ref 30.0–36.0)
MCV: 94 fL (ref 80.0–100.0)
Monocytes Absolute: 0.6 10*3/uL (ref 0.1–1.0)
Monocytes Relative: 12 %
Neutro Abs: 2.5 10*3/uL (ref 1.7–7.7)
Neutrophils Relative %: 54 %
Platelets: 213 10*3/uL (ref 150–400)
RBC: 4.02 MIL/uL (ref 3.87–5.11)
RDW: 12.6 % (ref 11.5–15.5)
WBC: 4.6 10*3/uL (ref 4.0–10.5)
nRBC: 0 % (ref 0.0–0.2)

## 2020-01-19 LAB — ABO/RH: ABO/RH(D): O POS

## 2020-01-19 LAB — PROTIME-INR
INR: 1 (ref 0.8–1.2)
Prothrombin Time: 12.5 seconds (ref 11.4–15.2)

## 2020-01-19 LAB — SARS CORONAVIRUS 2 (TAT 6-24 HRS): SARS Coronavirus 2: NEGATIVE

## 2020-01-19 LAB — SURGICAL PCR SCREEN
MRSA, PCR: NEGATIVE
Staphylococcus aureus: NEGATIVE

## 2020-01-19 NOTE — Progress Notes (Signed)
PCP - Dr. Audie Pinto Last office visit 11/13/2019 Cardiologist - N/A  Chest x-ray - greater than 1 year EKG - 11/13/2019 from Dr. Pennie Banter office Stress Test - greater than 2years ECHO - N/A Cardiac Cath -  N/A  Sleep Study -  N/A CPAP -  N/A  Fasting Blood Sugar -  N/A Checks Blood Sugar __ N/A___ times a day  Blood Thinner Instructions:  N/A Aspirin Instructions:  N/A Last Dose:  N/A  Anesthesia review: SOB going up stairs, History of COVID 08/2019, HTN, CKD  Patient denies shortness of breath, fever, cough and chest pain at PAT appointment   Patient verbalized understanding of instructions that were given to them at the PAT appointment. Patient was also instructed that they will need to review over the PAT instructions again at home before surgery.

## 2020-01-19 NOTE — Progress Notes (Signed)
Surgical clearance, last office visit note and EKG 11/2019 received and placed in hard chart.

## 2020-01-20 NOTE — Progress Notes (Signed)
Anesthesia Chart Review   Case: [id number] Date/Time: 01/22/20 0901   Procedure: LEFT TOTAL KNEE ARTHROPLASTY, RIGHT KNEE CORTISONE INJECTION (Left Knee) - 54 MINS   Anesthesia type: Spinal   Pre-op diagnosis: Left knee and right knee osteoarthritis   Location: [facility] ROOM 09 / [facility]   Surgeons: Paralee Cancel, MD      DISCUSSION:73 y.o. never smoker with h/o PONV, HTN, fibromyalgia, HLD, CKD Stage III, GERD, left knee and right knee OA scheduled for above procedure 01/22/20 with Dr. Paralee Cancel.   Clearance received from PCP, Dr. Shelia Media, 01/05/20 which states pt is low risk for surgical procedure, "Patient is in good health generally."    Anticipate pt can proceed with planned procedure barring acute status change.   VS: BP (!) 154/80 (BP Location: Right Arm)   Pulse 63   Temp 36.9 C (Oral)   Resp 18   Ht '5\' 3"'  (1.6 m)   Wt 87.6 kg   SpO2 98%   BMI 34.20 kg/m   PROVIDERS: Deland Pretty, MD is PCP last OV note 11/13/2019   LABS: Labs reviewed: Acceptable for surgery. (all labs ordered are listed, but only abnormal results are displayed)  Labs Reviewed  COMPREHENSIVE METABOLIC PANEL - Abnormal; Notable for the following components:      Result Value   Glucose, Bld 101 (*)    BUN 34 (*)    Creatinine, Ser 1.07 (*)    GFR calc non Af Amer 51 (*)    GFR calc Af Amer 59 (*)    All other components within normal limits  SURGICAL PCR SCREEN  CBC WITH DIFFERENTIAL/PLATELET  PROTIME-INR  TYPE AND SCREEN  ABO/RH     IMAGES:   EKG: 11/20/19 (on chart) Rate 65 bpm Sinus rhythm   CV:  Past Medical History:  Diagnosis Date  . Arthritis   . Asthma   . Basal cell carcinoma    bilateral legs  . Bursitis    Right hip  . CKD (chronic kidney disease), stage III   . Complication of anesthesia    prolonged sedation after breast surgery age 89 or 17  . Dyspnea    when going up stairs  . Environmental and seasonal allergies   . FHx: BRCA2 gene positive    BRCA2 mutation in  sister  . Fibromyalgia   . GERD (gastroesophageal reflux disease)    History of  . Hearing loss   . Heart murmur    slight noted occassionally  . History of COVID-19 08/2019  . History of hiatal hernia   . History of varicose veins   . Hyperlipidemia   . Hypertension   . Hypothyroid   . Migraines    Ocular, seldom  . Pneumonia   . PONV (postoperative nausea and vomiting)   . Post partum depression   . Tinnitus    Right  . Vitamin D deficiency     Past Surgical History:  Procedure Laterality Date  . ABDOMINAL HYSTERECTOMY    . ANKLE FRACTURE SURGERY Right   . BREAST LUMPECTOMY Right   . CERVIX LESION DESTRUCTION    . COLONOSCOPY    . ELBOW SURGERY Left   . EYE SURGERY Bilateral    Corneal Surgery  . TMJ ARTHROSCOPY    . UPPER GI ENDOSCOPY    . VARICOSE VEIN SURGERY      MEDICATIONS: . acetaminophen (TYLENOL) 500 MG tablet  . albuterol (VENTOLIN HFA) 108 (90 Base) MCG/ACT inhaler  . Ascorbic Acid (SUPER  C COMPLEX PO)  . azithromycin (ZITHROMAX Z-PAK) 250 MG tablet  . cephALEXin (KEFLEX) 500 MG capsule  . cetirizine (ZYRTEC) 10 MG tablet  . cholecalciferol (VITAMIN D) 1000 UNITS tablet  . dextromethorphan (DELSYM) 30 MG/5ML liquid  . docusate sodium (COLACE) 100 MG capsule  . HYDROcodone-acetaminophen (NORCO/VICODIN) 5-325 MG per tablet  . levothyroxine (SYNTHROID, LEVOTHROID) 75 MCG tablet  . lisinopril-hydrochlorothiazide (PRINZIDE,ZESTORETIC) 20-25 MG per tablet  . Melatonin 5 MG TABS  . pravastatin (PRAVACHOL) 10 MG tablet  . TURMERIC PO   No current facility-administered medications for this encounter.    Maia Plan [facility] Pre-Surgical Testing [phone] 01/20/20  2:22 PM

## 2020-01-21 NOTE — Anesthesia Preprocedure Evaluation (Addendum)
Anesthesia Evaluation  Patient identified by MRN, date of birth, ID band Patient awake    Reviewed: Allergy & Precautions, NPO status , Patient's Chart, lab work & pertinent test results  History of Anesthesia Complications (+) PONV, PROLONGED EMERGENCE and history of anesthetic complications  Airway Mallampati: II  TM Distance: >3 FB Neck ROM: Full    Dental  (+) Dental Advisory Given, Chipped,    Pulmonary asthma ,    Pulmonary exam normal        Cardiovascular hypertension, Pt. on medications (-) anginaNormal cardiovascular exam     Neuro/Psych  Headaches, PSYCHIATRIC DISORDERS Depression  Tinnitus Hearing loss     GI/Hepatic Neg liver ROS, hiatal hernia, GERD  Controlled,  Endo/Other  Hypothyroidism  Obesity   Renal/GU CRFRenal disease     Musculoskeletal  (+) Arthritis , Fibromyalgia -  Abdominal   Peds  Hematology negative hematology ROS (+)   Anesthesia Other Findings Covid neg 01/19/20 (+ back in 9/20)   Reproductive/Obstetrics                           Anesthesia Physical Anesthesia Plan  ASA: III  Anesthesia Plan: Spinal   Post-op Pain Management:  Regional for Post-op pain   Induction:   PONV Risk Score and Plan: 3 and Treatment may vary due to age or medical condition and Propofol infusion  Airway Management Planned: Natural Airway and Simple Face Mask  Additional Equipment: None  Intra-op Plan:   Post-operative Plan:   Informed Consent: I have reviewed the patients History and Physical, chart, labs and discussed the procedure including the risks, benefits and alternatives for the proposed anesthesia with the patient or authorized representative who has indicated his/her understanding and acceptance.       Plan Discussed with: CRNA and Anesthesiologist  Anesthesia Plan Comments: (Labs reviewed, platelets acceptable. Discussed risks and benefits of  spinal, including spinal/epidural hematoma, infection, failed block, and PDPH. Patient expressed understanding and wished to proceed. )       Anesthesia Quick Evaluation

## 2020-01-22 ENCOUNTER — Observation Stay (HOSPITAL_COMMUNITY)
Admission: AD | Admit: 2020-01-22 | Discharge: 2020-01-23 | Disposition: A | Discharge status: Home / Self Care | Payer: Medicare HMO | Attending: Orthopedic Surgery | Admitting: Orthopedic Surgery

## 2020-01-22 ENCOUNTER — Ambulatory Visit (HOSPITAL_COMMUNITY): Payer: Medicare HMO | Admitting: Physician Assistant

## 2020-01-22 ENCOUNTER — Other Ambulatory Visit: Payer: Self-pay

## 2020-01-22 ENCOUNTER — Ambulatory Visit (HOSPITAL_COMMUNITY): Payer: Medicare HMO | Admitting: Anesthesiology

## 2020-01-22 ENCOUNTER — Encounter (HOSPITAL_COMMUNITY)
Admission: AD | Disposition: A | Discharge status: Home / Self Care | Payer: Self-pay | Source: Home / Self Care | Attending: Orthopedic Surgery

## 2020-01-22 ENCOUNTER — Encounter (HOSPITAL_COMMUNITY): Payer: Self-pay | Admitting: Orthopedic Surgery

## 2020-01-22 DIAGNOSIS — G8918 Other acute postprocedural pain: Secondary | ICD-10-CM | POA: Diagnosis not present

## 2020-01-22 DIAGNOSIS — Z8541 Personal history of malignant neoplasm of cervix uteri: Secondary | ICD-10-CM | POA: Diagnosis not present

## 2020-01-22 DIAGNOSIS — Z885 Allergy status to narcotic agent status: Secondary | ICD-10-CM | POA: Diagnosis not present

## 2020-01-22 DIAGNOSIS — M25462 Effusion, left knee: Secondary | ICD-10-CM | POA: Insufficient documentation

## 2020-01-22 DIAGNOSIS — M170 Bilateral primary osteoarthritis of knee: Principal | ICD-10-CM | POA: Insufficient documentation

## 2020-01-22 DIAGNOSIS — Z7982 Long term (current) use of aspirin: Secondary | ICD-10-CM | POA: Insufficient documentation

## 2020-01-22 DIAGNOSIS — J45909 Unspecified asthma, uncomplicated: Secondary | ICD-10-CM | POA: Diagnosis not present

## 2020-01-22 DIAGNOSIS — E039 Hypothyroidism, unspecified: Secondary | ICD-10-CM | POA: Diagnosis not present

## 2020-01-22 DIAGNOSIS — Z79890 Hormone replacement therapy (postmenopausal): Secondary | ICD-10-CM | POA: Diagnosis not present

## 2020-01-22 DIAGNOSIS — M238X2 Other internal derangements of left knee: Secondary | ICD-10-CM | POA: Insufficient documentation

## 2020-01-22 DIAGNOSIS — M797 Fibromyalgia: Secondary | ICD-10-CM | POA: Diagnosis not present

## 2020-01-22 DIAGNOSIS — Z6834 Body mass index (BMI) 34.0-34.9, adult: Secondary | ICD-10-CM | POA: Diagnosis not present

## 2020-01-22 DIAGNOSIS — E669 Obesity, unspecified: Secondary | ICD-10-CM | POA: Diagnosis present

## 2020-01-22 DIAGNOSIS — M25862 Other specified joint disorders, left knee: Secondary | ICD-10-CM | POA: Diagnosis not present

## 2020-01-22 DIAGNOSIS — M1712 Unilateral primary osteoarthritis, left knee: Secondary | ICD-10-CM | POA: Diagnosis not present

## 2020-01-22 DIAGNOSIS — I10 Essential (primary) hypertension: Secondary | ICD-10-CM | POA: Insufficient documentation

## 2020-01-22 DIAGNOSIS — K219 Gastro-esophageal reflux disease without esophagitis: Secondary | ICD-10-CM | POA: Insufficient documentation

## 2020-01-22 DIAGNOSIS — Z803 Family history of malignant neoplasm of breast: Secondary | ICD-10-CM | POA: Insufficient documentation

## 2020-01-22 DIAGNOSIS — M1711 Unilateral primary osteoarthritis, right knee: Secondary | ICD-10-CM | POA: Diagnosis present

## 2020-01-22 DIAGNOSIS — Z79899 Other long term (current) drug therapy: Secondary | ICD-10-CM | POA: Insufficient documentation

## 2020-01-22 DIAGNOSIS — H9190 Unspecified hearing loss, unspecified ear: Secondary | ICD-10-CM | POA: Diagnosis not present

## 2020-01-22 DIAGNOSIS — Z808 Family history of malignant neoplasm of other organs or systems: Secondary | ICD-10-CM | POA: Diagnosis not present

## 2020-01-22 DIAGNOSIS — M659 Synovitis and tenosynovitis, unspecified: Secondary | ICD-10-CM | POA: Insufficient documentation

## 2020-01-22 DIAGNOSIS — Z8616 Personal history of COVID-19: Secondary | ICD-10-CM | POA: Insufficient documentation

## 2020-01-22 DIAGNOSIS — M25762 Osteophyte, left knee: Secondary | ICD-10-CM | POA: Diagnosis not present

## 2020-01-22 DIAGNOSIS — K449 Diaphragmatic hernia without obstruction or gangrene: Secondary | ICD-10-CM | POA: Insufficient documentation

## 2020-01-22 DIAGNOSIS — Z96652 Presence of left artificial knee joint: Secondary | ICD-10-CM

## 2020-01-22 DIAGNOSIS — M25769 Osteophyte, unspecified knee: Secondary | ICD-10-CM | POA: Diagnosis not present

## 2020-01-22 HISTORY — PX: TOTAL KNEE ARTHROPLASTY: SHX125

## 2020-01-22 LAB — TYPE AND SCREEN
ABO/RH(D): O POS
Antibody Screen: NEGATIVE

## 2020-01-22 SURGERY — ARTHROPLASTY, KNEE, TOTAL
Anesthesia: Spinal | Site: Knee | Laterality: Left

## 2020-01-22 MED ORDER — METOCLOPRAMIDE HCL 5 MG PO TABS: 5.0000 mg | ORAL_TABLET | Freq: Three times a day (TID) | ORAL | Status: DC | PRN

## 2020-01-22 MED ORDER — ALUM & MAG HYDROXIDE-SIMETH 200-200-20 MG/5ML PO SUSP: 15.0000 mL | ORAL | Status: DC | PRN

## 2020-01-22 MED ORDER — DEXAMETHASONE SODIUM PHOSPHATE 10 MG/ML IJ SOLN
10.0000 mg | Freq: Once | INTRAMUSCULAR | Status: AC
  Administered 2020-01-23: 10 mg via INTRAVENOUS
  Filled 2020-01-22: qty 1

## 2020-01-22 MED ORDER — STERILE WATER FOR IRRIGATION IR SOLN
Status: DC | PRN
  Administered 2020-01-22: 2000 mL

## 2020-01-22 MED ORDER — ONDANSETRON HCL 4 MG/2ML IJ SOLN
4.0000 mg | Freq: Four times a day (QID) | INTRAMUSCULAR | Status: DC | PRN
  Administered 2020-01-22: 4 mg via INTRAVENOUS
  Filled 2020-01-22: qty 2

## 2020-01-22 MED ORDER — CEFAZOLIN SODIUM-DEXTROSE 2-4 GM/100ML-% IV SOLN
INTRAVENOUS | Status: AC
  Filled 2020-01-22: qty 100

## 2020-01-22 MED ORDER — POLYETHYLENE GLYCOL 3350 17 G PO PACK
17.0000 g | PACK | Freq: Two times a day (BID) | ORAL | Status: DC
  Administered 2020-01-22 – 2020-01-23 (×2): 17 g via ORAL
  Filled 2020-01-22 (×2): qty 1

## 2020-01-22 MED ORDER — MIDAZOLAM HCL 2 MG/2ML IJ SOLN
INTRAMUSCULAR | Status: AC
  Filled 2020-01-22: qty 2

## 2020-01-22 MED ORDER — CHLORHEXIDINE GLUCONATE 4 % EX LIQD: 60.0000 mL | Freq: Once | CUTANEOUS | Status: DC

## 2020-01-22 MED ORDER — PRAVASTATIN SODIUM 10 MG PO TABS
10.0000 mg | ORAL_TABLET | Freq: Every evening | ORAL | Status: DC
  Administered 2020-01-22: 10 mg via ORAL
  Filled 2020-01-22: qty 1

## 2020-01-22 MED ORDER — HYDROMORPHONE HCL 2 MG PO TABS
2.0000 mg | ORAL_TABLET | ORAL | Status: DC | PRN
  Administered 2020-01-22: 2 mg via ORAL
  Filled 2020-01-22: qty 1

## 2020-01-22 MED ORDER — METHOCARBAMOL 500 MG PO TABS
500.0000 mg | ORAL_TABLET | Freq: Four times a day (QID) | ORAL | Status: DC | PRN
  Administered 2020-01-22 – 2020-01-23 (×2): 500 mg via ORAL
  Filled 2020-01-22 (×2): qty 1

## 2020-01-22 MED ORDER — LORATADINE 10 MG PO TABS
10.0000 mg | ORAL_TABLET | Freq: Every day | ORAL | Status: DC
  Administered 2020-01-22 – 2020-01-23 (×2): 10 mg via ORAL
  Filled 2020-01-22 (×2): qty 1

## 2020-01-22 MED ORDER — FENTANYL CITRATE (PF) 100 MCG/2ML IJ SOLN
INTRAMUSCULAR | Status: DC | PRN
  Administered 2020-01-22 (×2): 25 ug via INTRAVENOUS

## 2020-01-22 MED ORDER — SODIUM CHLORIDE (PF) 0.9 % IJ SOLN
INTRAMUSCULAR | Status: AC
  Filled 2020-01-22: qty 50

## 2020-01-22 MED ORDER — LEVOTHYROXINE SODIUM 75 MCG PO TABS
75.0000 ug | ORAL_TABLET | Freq: Every day | ORAL | Status: DC
  Administered 2020-01-23: 75 ug via ORAL
  Filled 2020-01-22: qty 1

## 2020-01-22 MED ORDER — FERROUS SULFATE 325 (65 FE) MG PO TABS: 325.0000 mg | ORAL_TABLET | Freq: Three times a day (TID) | ORAL | 0 refills | Status: DC

## 2020-01-22 MED ORDER — BUPIVACAINE HCL (PF) 0.25 % IJ SOLN
INTRAMUSCULAR | Status: AC
  Filled 2020-01-22: qty 30

## 2020-01-22 MED ORDER — MIDAZOLAM HCL 2 MG/2ML IJ SOLN: 1.0000 mg | Freq: Once | INTRAMUSCULAR | Status: AC

## 2020-01-22 MED ORDER — DOCUSATE SODIUM 100 MG PO CAPS: 100.0000 mg | ORAL_CAPSULE | Freq: Two times a day (BID) | ORAL | 0 refills | Status: DC

## 2020-01-22 MED ORDER — DIPHENHYDRAMINE HCL 12.5 MG/5ML PO ELIX: 12.5000 mg | ORAL_SOLUTION | ORAL | Status: DC | PRN

## 2020-01-22 MED ORDER — POVIDONE-IODINE 10 % EX SWAB
2.0000 "application " | Freq: Once | CUTANEOUS | Status: AC
  Administered 2020-01-22: 2 via TOPICAL

## 2020-01-22 MED ORDER — FENTANYL CITRATE (PF) 100 MCG/2ML IJ SOLN
INTRAMUSCULAR | Status: AC
  Administered 2020-01-22: 50 ug via INTRAVENOUS
  Filled 2020-01-22: qty 2

## 2020-01-22 MED ORDER — BUPIVACAINE HCL (PF) 0.25 % IJ SOLN
INTRAMUSCULAR | Status: DC | PRN
  Administered 2020-01-22: 30 mL

## 2020-01-22 MED ORDER — MIDAZOLAM HCL 2 MG/2ML IJ SOLN
INTRAMUSCULAR | Status: AC
  Administered 2020-01-22: 1 mg via INTRAVENOUS
  Filled 2020-01-22: qty 2

## 2020-01-22 MED ORDER — METHYLPREDNISOLONE ACETATE 80 MG/ML IJ SUSP
INTRAMUSCULAR | Status: DC | PRN
  Administered 2020-01-22: 5 mL via INTRA_ARTICULAR

## 2020-01-22 MED ORDER — ONDANSETRON HCL 4 MG/2ML IJ SOLN
INTRAMUSCULAR | Status: DC | PRN
  Administered 2020-01-22: 4 mg via INTRAVENOUS

## 2020-01-22 MED ORDER — KETOROLAC TROMETHAMINE 30 MG/ML IJ SOLN
INTRAMUSCULAR | Status: AC
  Filled 2020-01-22: qty 1

## 2020-01-22 MED ORDER — ONDANSETRON HCL 4 MG/2ML IJ SOLN
INTRAMUSCULAR | Status: AC
  Filled 2020-01-22: qty 2

## 2020-01-22 MED ORDER — LACTATED RINGERS IV BOLUS
500.0000 mL | Freq: Once | INTRAVENOUS | Status: AC
  Administered 2020-01-22: 500 mL via INTRAVENOUS

## 2020-01-22 MED ORDER — POLYETHYLENE GLYCOL 3350 17 G PO PACK: 17.0000 g | PACK | Freq: Two times a day (BID) | ORAL | 0 refills | Status: DC

## 2020-01-22 MED ORDER — ACETAMINOPHEN 500 MG PO TABS: 1000.0000 mg | ORAL_TABLET | Freq: Three times a day (TID) | ORAL | 0 refills | Status: DC

## 2020-01-22 MED ORDER — ASPIRIN 81 MG PO CHEW: 81.0000 mg | CHEWABLE_TABLET | Freq: Two times a day (BID) | ORAL | 0 refills | Status: AC

## 2020-01-22 MED ORDER — KETOROLAC TROMETHAMINE 30 MG/ML IJ SOLN
INTRAMUSCULAR | Status: DC | PRN
  Administered 2020-01-22: 30 mg

## 2020-01-22 MED ORDER — PHENOL 1.4 % MT LIQD
1.0000 | OROMUCOSAL | Status: DC | PRN
  Filled 2020-01-22: qty 177

## 2020-01-22 MED ORDER — BISACODYL 10 MG RE SUPP: 10.0000 mg | Freq: Every day | RECTAL | Status: DC | PRN

## 2020-01-22 MED ORDER — MENTHOL 3 MG MT LOZG: 1.0000 | LOZENGE | OROMUCOSAL | Status: DC | PRN

## 2020-01-22 MED ORDER — ALBUTEROL SULFATE (2.5 MG/3ML) 0.083% IN NEBU: 3.0000 mL | INHALATION_SOLUTION | Freq: Four times a day (QID) | RESPIRATORY_TRACT | Status: DC | PRN

## 2020-01-22 MED ORDER — METHOCARBAMOL 500 MG IVPB - SIMPLE MED
500.0000 mg | Freq: Four times a day (QID) | INTRAVENOUS | Status: DC | PRN
  Filled 2020-01-22: qty 50

## 2020-01-22 MED ORDER — SODIUM CHLORIDE 0.9 % IR SOLN
Status: DC | PRN
  Administered 2020-01-22: 1000 mL

## 2020-01-22 MED ORDER — TRANEXAMIC ACID-NACL 1000-0.7 MG/100ML-% IV SOLN
INTRAVENOUS | Status: AC
  Filled 2020-01-22: qty 100

## 2020-01-22 MED ORDER — PHENYLEPHRINE 40 MCG/ML (10ML) SYRINGE FOR IV PUSH (FOR BLOOD PRESSURE SUPPORT)
PREFILLED_SYRINGE | INTRAVENOUS | Status: AC
  Filled 2020-01-22: qty 10

## 2020-01-22 MED ORDER — MAGNESIUM CITRATE PO SOLN: 1.0000 | Freq: Once | ORAL | Status: DC | PRN

## 2020-01-22 MED ORDER — HYDROMORPHONE HCL 1 MG/ML IJ SOLN
0.5000 mg | INTRAMUSCULAR | Status: DC | PRN
  Administered 2020-01-22: 0.5 mg via INTRAVENOUS
  Filled 2020-01-22: qty 1

## 2020-01-22 MED ORDER — FENTANYL CITRATE (PF) 100 MCG/2ML IJ SOLN
INTRAMUSCULAR | Status: AC
  Filled 2020-01-22: qty 2

## 2020-01-22 MED ORDER — PHENYLEPHRINE 40 MCG/ML (10ML) SYRINGE FOR IV PUSH (FOR BLOOD PRESSURE SUPPORT)
PREFILLED_SYRINGE | INTRAVENOUS | Status: DC | PRN
  Administered 2020-01-22 (×4): 40 ug via INTRAVENOUS
  Administered 2020-01-22: 80 ug via INTRAVENOUS

## 2020-01-22 MED ORDER — DEXAMETHASONE SODIUM PHOSPHATE 10 MG/ML IJ SOLN
10.0000 mg | Freq: Once | INTRAMUSCULAR | Status: AC
  Administered 2020-01-22: 8 mg via INTRAVENOUS

## 2020-01-22 MED ORDER — HYDROMORPHONE HCL 2 MG PO TABS
ORAL_TABLET | ORAL | Status: AC
  Administered 2020-01-22: 2 mg via ORAL
  Filled 2020-01-22: qty 1

## 2020-01-22 MED ORDER — TRANEXAMIC ACID-NACL 1000-0.7 MG/100ML-% IV SOLN
1000.0000 mg | Freq: Once | INTRAVENOUS | Status: AC
  Administered 2020-01-22: 1000 mg via INTRAVENOUS
  Filled 2020-01-22: qty 100

## 2020-01-22 MED ORDER — MIDAZOLAM HCL 5 MG/5ML IJ SOLN
INTRAMUSCULAR | Status: DC | PRN
  Administered 2020-01-22: 1 mg via INTRAVENOUS

## 2020-01-22 MED ORDER — METHYLPREDNISOLONE ACETATE 40 MG/ML IJ SUSP
INTRAMUSCULAR | Status: AC
  Filled 2020-01-22: qty 2

## 2020-01-22 MED ORDER — ACETAMINOPHEN 500 MG PO TABS
1000.0000 mg | ORAL_TABLET | Freq: Four times a day (QID) | ORAL | Status: DC
  Administered 2020-01-22 – 2020-01-23 (×4): 1000 mg via ORAL
  Filled 2020-01-22 (×3): qty 2

## 2020-01-22 MED ORDER — ONDANSETRON HCL 4 MG PO TABS: 4.0000 mg | ORAL_TABLET | Freq: Four times a day (QID) | ORAL | Status: DC | PRN

## 2020-01-22 MED ORDER — MEPIVACAINE HCL (PF) 2 % IJ SOLN
INTRAMUSCULAR | Status: DC | PRN
  Administered 2020-01-22: 3 mL via INTRATHECAL

## 2020-01-22 MED ORDER — LACTATED RINGERS IV SOLN: INTRAVENOUS | Status: DC

## 2020-01-22 MED ORDER — DOCUSATE SODIUM 100 MG PO CAPS
100.0000 mg | ORAL_CAPSULE | Freq: Two times a day (BID) | ORAL | Status: DC
  Administered 2020-01-22 – 2020-01-23 (×2): 100 mg via ORAL
  Filled 2020-01-22 (×2): qty 1

## 2020-01-22 MED ORDER — CEFAZOLIN SODIUM-DEXTROSE 2-4 GM/100ML-% IV SOLN
2.0000 g | INTRAVENOUS | Status: AC
  Administered 2020-01-22: 2 g via INTRAVENOUS

## 2020-01-22 MED ORDER — OXYCODONE HCL 5 MG/5ML PO SOLN: 5.0000 mg | Freq: Once | ORAL | Status: DC | PRN

## 2020-01-22 MED ORDER — HYDROMORPHONE HCL 2 MG PO TABS: 2.0000 mg | ORAL_TABLET | ORAL | 0 refills | Status: DC | PRN

## 2020-01-22 MED ORDER — ASPIRIN 81 MG PO CHEW
81.0000 mg | CHEWABLE_TABLET | Freq: Two times a day (BID) | ORAL | Status: DC
  Administered 2020-01-22 – 2020-01-23 (×2): 81 mg via ORAL
  Filled 2020-01-22 (×2): qty 1

## 2020-01-22 MED ORDER — SODIUM CHLORIDE (PF) 0.9 % IJ SOLN
INTRAMUSCULAR | Status: DC | PRN
  Administered 2020-01-22: 30 mL

## 2020-01-22 MED ORDER — OXYCODONE HCL 5 MG PO TABS: 5.0000 mg | ORAL_TABLET | Freq: Once | ORAL | Status: DC | PRN

## 2020-01-22 MED ORDER — METOCLOPRAMIDE HCL 5 MG/ML IJ SOLN: 5.0000 mg | Freq: Three times a day (TID) | INTRAMUSCULAR | Status: DC | PRN

## 2020-01-22 MED ORDER — PROPOFOL 500 MG/50ML IV EMUL
INTRAVENOUS | Status: DC | PRN
  Administered 2020-01-22: 100 ug/kg/min via INTRAVENOUS

## 2020-01-22 MED ORDER — PROPOFOL 10 MG/ML IV BOLUS
INTRAVENOUS | Status: DC | PRN
  Administered 2020-01-22 (×2): 20 mg via INTRAVENOUS

## 2020-01-22 MED ORDER — FERROUS SULFATE 325 (65 FE) MG PO TABS
325.0000 mg | ORAL_TABLET | Freq: Two times a day (BID) | ORAL | Status: DC
  Administered 2020-01-22 – 2020-01-23 (×2): 325 mg via ORAL
  Filled 2020-01-22 (×2): qty 1

## 2020-01-22 MED ORDER — TRANEXAMIC ACID-NACL 1000-0.7 MG/100ML-% IV SOLN
1000.0000 mg | INTRAVENOUS | Status: AC
  Administered 2020-01-22: 09:00:00 1000 mg via INTRAVENOUS

## 2020-01-22 MED ORDER — DEXAMETHASONE SODIUM PHOSPHATE 10 MG/ML IJ SOLN
INTRAMUSCULAR | Status: AC
  Filled 2020-01-22: qty 1

## 2020-01-22 MED ORDER — ONDANSETRON HCL 4 MG/2ML IJ SOLN: 4.0000 mg | Freq: Once | INTRAMUSCULAR | Status: DC | PRN

## 2020-01-22 MED ORDER — ROPIVACAINE HCL 7.5 MG/ML IJ SOLN
INTRAMUSCULAR | Status: DC | PRN
  Administered 2020-01-22: 20 mL via PERINEURAL

## 2020-01-22 MED ORDER — LACTATED RINGERS IV BOLUS: 250.0000 mL | Freq: Once | INTRAVENOUS | Status: DC

## 2020-01-22 MED ORDER — PROPOFOL 500 MG/50ML IV EMUL
INTRAVENOUS | Status: AC
  Filled 2020-01-22: qty 50

## 2020-01-22 MED ORDER — CEFAZOLIN SODIUM-DEXTROSE 2-4 GM/100ML-% IV SOLN
2.0000 g | Freq: Four times a day (QID) | INTRAVENOUS | Status: AC
  Administered 2020-01-22 (×2): 2 g via INTRAVENOUS
  Filled 2020-01-22: qty 100

## 2020-01-22 MED ORDER — METHOCARBAMOL 500 MG IVPB - SIMPLE MED
INTRAVENOUS | Status: AC
  Administered 2020-01-22: 500 mg via INTRAVENOUS
  Filled 2020-01-22: qty 50

## 2020-01-22 MED ORDER — METHOCARBAMOL 500 MG PO TABS: 500.0000 mg | ORAL_TABLET | Freq: Four times a day (QID) | ORAL | 0 refills | Status: DC | PRN

## 2020-01-22 MED ORDER — FENTANYL CITRATE (PF) 100 MCG/2ML IJ SOLN: 50.0000 ug | Freq: Once | INTRAMUSCULAR | Status: AC

## 2020-01-22 MED ORDER — FENTANYL CITRATE (PF) 100 MCG/2ML IJ SOLN: 25.0000 ug | INTRAMUSCULAR | Status: DC | PRN

## 2020-01-22 MED ORDER — SODIUM CHLORIDE 0.9 % IV SOLN: INTRAVENOUS | Status: DC

## 2020-01-22 MED ORDER — ACETAMINOPHEN 500 MG PO TABS
ORAL_TABLET | ORAL | Status: AC
  Filled 2020-01-22: qty 2

## 2020-01-22 SURGICAL SUPPLY — 62 items
ATTUNE MED ANAT PAT 32 KNEE (Knees) ×2 IMPLANT
ATTUNE MED ANAT PAT 32MM KNEE (Knees) ×1 IMPLANT
ATTUNE PSFEM LTSZ4 NARCEM KNEE (Femur) ×3 IMPLANT
ATTUNE PSRP INSR SZ4 7 KNEE (Insert) ×2 IMPLANT
ATTUNE PSRP INSR SZ4 7MM KNEE (Insert) ×1 IMPLANT
BAG ZIPLOCK 12X15 (MISCELLANEOUS) IMPLANT
BASEPLATE TIBIAL ROTATING SZ 4 (Knees) ×3 IMPLANT
BLADE SAW SGTL 11.0X1.19X90.0M (BLADE) IMPLANT
BLADE SAW SGTL 13.0X1.19X90.0M (BLADE) ×3 IMPLANT
BLADE SURG SZ10 CARB STEEL (BLADE) ×6 IMPLANT
BNDG ELASTIC 6X5.8 VLCR STR LF (GAUZE/BANDAGES/DRESSINGS) ×3 IMPLANT
BOWL SMART MIX CTS (DISPOSABLE) ×3 IMPLANT
CEMENT HV SMART SET (Cement) ×6 IMPLANT
COVER SURGICAL LIGHT HANDLE (MISCELLANEOUS) ×3 IMPLANT
COVER WAND RF STERILE (DRAPES) IMPLANT
CUFF TOURN SGL QUICK 34 (TOURNIQUET CUFF) ×2
CUFF TRNQT CYL 34X4.125X (TOURNIQUET CUFF) ×1 IMPLANT
DECANTER SPIKE VIAL GLASS SM (MISCELLANEOUS) ×6 IMPLANT
DERMABOND ADVANCED (GAUZE/BANDAGES/DRESSINGS) ×2
DERMABOND ADVANCED .7 DNX12 (GAUZE/BANDAGES/DRESSINGS) ×1 IMPLANT
DRAPE U-SHAPE 47X51 STRL (DRAPES) ×3 IMPLANT
DRESSING AQUACEL AG SP 3.5X10 (GAUZE/BANDAGES/DRESSINGS) ×1 IMPLANT
DRSG AQUACEL AG SP 3.5X10 (GAUZE/BANDAGES/DRESSINGS) ×3
DURAPREP 26ML APPLICATOR (WOUND CARE) ×6 IMPLANT
ELECT REM PT RETURN 15FT ADLT (MISCELLANEOUS) ×3 IMPLANT
GLOVE BIO SURGEON STRL SZ 6 (GLOVE) ×3 IMPLANT
GLOVE BIOGEL PI IND STRL 6.5 (GLOVE) ×1 IMPLANT
GLOVE BIOGEL PI IND STRL 7.5 (GLOVE) ×1 IMPLANT
GLOVE BIOGEL PI IND STRL 8.5 (GLOVE) ×1 IMPLANT
GLOVE BIOGEL PI INDICATOR 6.5 (GLOVE) ×2
GLOVE BIOGEL PI INDICATOR 7.5 (GLOVE) ×2
GLOVE BIOGEL PI INDICATOR 8.5 (GLOVE) ×2
GLOVE ECLIPSE 8.0 STRL XLNG CF (GLOVE) ×3 IMPLANT
GLOVE ORTHO TXT STRL SZ7.5 (GLOVE) ×3 IMPLANT
GOWN STRL REUS W/ TWL LRG LVL3 (GOWN DISPOSABLE) ×1 IMPLANT
GOWN STRL REUS W/TWL 2XL LVL3 (GOWN DISPOSABLE) ×3 IMPLANT
GOWN STRL REUS W/TWL LRG LVL3 (GOWN DISPOSABLE) ×5 IMPLANT
HANDPIECE INTERPULSE COAX TIP (DISPOSABLE) ×2
HOLDER FOLEY CATH W/STRAP (MISCELLANEOUS) IMPLANT
KIT TURNOVER KIT A (KITS) IMPLANT
MANIFOLD NEPTUNE II (INSTRUMENTS) ×3 IMPLANT
NDL SAFETY ECLIPSE 18X1.5 (NEEDLE) IMPLANT
NEEDLE HYPO 18GX1.5 SHARP (NEEDLE)
NS IRRIG 1000ML POUR BTL (IV SOLUTION) ×3 IMPLANT
PACK TOTAL KNEE CUSTOM (KITS) ×3 IMPLANT
PENCIL SMOKE EVACUATOR (MISCELLANEOUS) IMPLANT
PIN DRILL FIX HALF THREAD (BIT) ×3 IMPLANT
PIN FIX SIGMA LCS THRD HI (PIN) ×3 IMPLANT
PROTECTOR NERVE ULNAR (MISCELLANEOUS) ×3 IMPLANT
SET HNDPC FAN SPRY TIP SCT (DISPOSABLE) ×1 IMPLANT
SET PAD KNEE POSITIONER (MISCELLANEOUS) ×3 IMPLANT
SUT MNCRL AB 4-0 PS2 18 (SUTURE) ×3 IMPLANT
SUT STRATAFIX PDS+ 0 24IN (SUTURE) ×3 IMPLANT
SUT VIC AB 1 CT1 36 (SUTURE) ×3 IMPLANT
SUT VIC AB 2-0 CT1 27 (SUTURE) ×6
SUT VIC AB 2-0 CT1 TAPERPNT 27 (SUTURE) ×3 IMPLANT
SYR 3ML LL SCALE MARK (SYRINGE) ×3 IMPLANT
TRAY FOL W/BAG SLVR 16FR STRL (SET/KITS/TRAYS/PACK) ×1 IMPLANT
TRAY FOLEY W/BAG SLVR 16FR LF (SET/KITS/TRAYS/PACK) ×2
WATER STERILE IRR 1000ML POUR (IV SOLUTION) ×6 IMPLANT
WRAP KNEE MAXI GEL POST OP (GAUZE/BANDAGES/DRESSINGS) ×3 IMPLANT
YANKAUER SUCT BULB TIP 10FT TU (MISCELLANEOUS) ×3 IMPLANT

## 2020-01-22 NOTE — Progress Notes (Signed)
Patient arrived to the floor from PACU around 1630

## 2020-01-22 NOTE — Anesthesia Postprocedure Evaluation (Deleted)
Anesthesia Post Note  Patient: [name]  Procedure(s) Performed: LEFT TOTAL KNEE ARTHROPLASTY, RIGHT KNEE CORTISONE INJECTION (Left Knee)     Anesthesia Post Evaluation  Last Vitals:  Vitals:   01/22/20 0838 01/22/20 1041  BP:  (!) 105/53  Pulse: 64 73  Resp: 15 10  Temp:  36.7 C  SpO2: 100% 100%    Last Pain:  Vitals:   01/22/20 1041  TempSrc:   PainSc: 0-No pain                 Susan Webster

## 2020-01-22 NOTE — Anesthesia Procedure Notes (Addendum)
Procedure Name: MAC Date/Time: 01/22/2020 8:49 AM Performed by: Deliah Boston, CRNA Pre-anesthesia Checklist: Patient identified, Emergency Drugs available, Suction available and Patient being monitored Patient Re-evaluated:Patient Re-evaluated prior to induction Oxygen Delivery Method: Simple face mask Preoxygenation: Pre-oxygenation with 100% oxygen Induction Type: IV induction Placement Confirmation: positive ETCO2 and breath sounds checked- equal and bilateral

## 2020-01-22 NOTE — Care Plan (Signed)
Ortho Bundle Case Management Note  Patient Details  Name: Susan Webster MRN: [id number] Date of Birth: [birth year]  L TKA on 01-22-20 DCP:  Home with dtr.  1 story home with 2 ste.  DME:  No needs.  Borrowing a RW and has elevated toilets. PT:  [facility].  PT eval scheduled on 01-26-20 at 11:00 am.                   DME Arranged:  N/A DME Agency:  NA  HH Arranged:  NA HH Agency:  NA  Additional Comments: Please contact me with any questions of if this plan should need to change.  Marianne Sofia, RN,CCM [facility]  [phone] 01/22/2020, 8:44 AM

## 2020-01-22 NOTE — Discharge Instructions (Signed)

## 2020-01-22 NOTE — Op Note (Signed)
NAME:  Susan Webster                      MEDICAL RECORD NO.:  [id number]                             FACILITY:  [facility]      PHYSICIAN:  Pietro Cassis. Alvan Dame, M.D.  DATE OF BIRTH:  29-Oct-1946      DATE OF PROCEDURE:  01/22/2020                                     OPERATIVE REPORT         PREOPERATIVE DIAGNOSIS: 1. Left knee osteoarthritis. 2. Right knee osteoarthritis     POSTOPERATIVE DIAGNOSIS: 1.  Left knee osteoarthritis. 2. Right knee osteoarthritis     FINDINGS:  The patient was noted to have complete loss of cartilage and   bone-on-bone arthritis with associated osteophytes in the medial and patellofemoral compartments of   the knee with a significant synovitis and associated effusion.  The patient had failed months of conservative treatment including medications, injection therapy, activity modification.     PROCEDURE:  Left total knee replacement. 2. Right knee intra-articular cortisone injection (80mg  of Depomedrol)     COMPONENTS USED:  DePuy Attune rotating platform posterior stabilized knee   system, a size 4N femur, 4 tibia, size 7 mm PS AOX insert, and 32 anatomic patellar   button.      SURGEON:  Pietro Cassis. Alvan Dame, M.D.      ASSISTANT:  Griffith Citron, PA-C.      ANESTHESIA:  Regional and Spinal.      SPECIMENS:  None.      COMPLICATION:  None.      DRAINS:  None.  EBL: <100cc      TOURNIQUET TIME:   Total Tourniquet Time Documented: Thigh (Left) - 31 minutes Total: Thigh (Left) - 31 minutes  .      The patient was stable to the recovery room.      INDICATION FOR PROCEDURE:  Susan Webster is a 74 y.o. female patient of   mine.  The patient had been seen, evaluated, and treated for months conservatively in the   office with medication, activity modification, and injections.  The patient had   radiographic changes of bone-on-bone arthritis with endplate sclerosis and osteophytes noted.  Based on the radiographic changes and failed conservative measures, the  patient   decided to proceed with definitive treatment, total knee replacement.  Risks of infection, DVT, component failure, need for revision surgery, neurovascular injury were reviewed in the office setting.  The postop course was reviewed stressing the efforts to maximize post-operative satisfaction and function.  Consent was obtained for benefit of pain   relief.      PROCEDURE IN DETAIL:  The patient was brought to the operative theater.   Once adequate anesthesia, preoperative antibiotics, 2 gm of Ancef,1 gm of Tranexamic Acid, and 10 mg of Decadron administered, the patient was positioned supine with a left thigh tourniquet placed.  The  left lower extremity was prepped and draped in sterile fashion.  A time-   out was performed identifying the patient, planned procedure, and the appropriate extremity.      The left lower extremity was placed in the [facility] leg holder.  The leg was  exsanguinated, tourniquet elevated to 250 mmHg.  A midline incision was   made followed by median parapatellar arthrotomy.  Following initial   exposure, attention was first directed to the patella.  Precut   measurement was noted to be 20 mm.  I resected down to 14 mm and used a   32 anatomic patellar button to restore patellar height as well as cover the cut surface.      The lug holes were drilled and a metal shim was placed to protect the   patella from retractors and saw blade during the procedure.      At this point, attention was now directed to the femur.  The femoral   canal was opened with a drill, irrigated to try to prevent fat emboli.  An   intramedullary rod was passed at 3 degrees valgus, 9 mm of bone was   resected off the distal femur.  Following this resection, the tibia was   subluxated anteriorly.  Using the extramedullary guide, 2 mm of bone was resected off   the proximal medial tibia.  We confirmed the gap would be   stable medially and laterally with a size 5 spacer block as well as  confirmed that the tibial cut was perpendicular in the coronal plane, checking with an alignment rod.      Once this was done, I sized the femur to be a size 4 in the anterior-   posterior dimension, chose a narrow component based on medial and   lateral dimension.  The size 4 rotation block was then pinned in   position anterior referenced using the C-clamp to set rotation.  The   anterior, posterior, and  chamfer cuts were made without difficulty nor   notching making certain that I was along the anterior cortex to help   with flexion gap stability.      The final box cut was made off the lateral aspect of distal femur.      At this point, the tibia was sized to be a size 4.  The size 4 tray was   then pinned in position through the medial third of the tubercle,   drilled, and keel punched.  Trial reduction was now carried with a 4 femur,  4 tibia, a size 7 mm PS insert, and the 32 anatomic patella botton.  The knee was brought to full extension with good flexion stability with the patella   tracking through the trochlea without application of pressure.  Given   all these findings the trial components removed.  Final components were   opened and cement was mixed.  The knee was irrigated with normal saline solution and pulse lavage.  The synovial lining was   then injected with 30 cc of 0.25% Marcaine with epinephrine, 1 cc of Toradol and 30 cc of NS for a total of 61 cc.     Final implants were then cemented onto cleaned and dried cut surfaces of bone with the knee brought to extension with a size 7 mm PS trial insert.      Once the cement had fully cured, excess cement was removed   throughout the knee.  I confirmed that I was satisfied with the range of   motion and stability, and the final size 7 mm PS AOX insert was chosen.  It was   placed into the knee.      The tourniquet had been let down at 31 minutes.  No significant  hemostasis was required.  The extensor mechanism was then  reapproximated using #1 Vicryl and #1 Stratafix sutures with the knee   in flexion.  The   remaining wound was closed with 2-0 Vicryl and running 4-0 Monocryl.   The knee was cleaned, dried, dressed sterilely using Dermabond and   Aquacel dressing.    Once the left knee was dressed attention was directed to the right knee.  After sterile cleaning of lateral right knee the right knee was injected with 6 cc of Marcaine and 80 mg of Depomedrol.  The injection site was dressed with a Bandaid.  The patient was then   brought to recovery room in stable condition, tolerating the procedure   well.   Please note that Physician Assistant, Griffith Citron, PA-C was present for the entirety of the case, and was utilized for pre-operative positioning, peri-operative retractor management, general facilitation of the procedure and for primary wound closure at the end of the case.              Pietro Cassis Alvan Dame, M.D.    01/22/2020 10:08 AM

## 2020-01-22 NOTE — Anesthesia Procedure Notes (Signed)
Spinal  Patient location during procedure: OR Start time: 01/22/2020 8:49 AM End time: 01/22/2020 8:54 AM Staffing Performed: anesthesiologist  Anesthesiologist: Audry Pili, MD Preanesthetic Checklist Completed: patient identified, IV checked, risks and benefits discussed, surgical consent, monitors and equipment checked, pre-op evaluation and timeout performed Spinal Block Patient position: sitting Prep: DuraPrep Patient monitoring: heart rate, cardiac monitor, continuous pulse ox and blood pressure Approach: midline Location: L2-3 Injection technique: single-shot Needle Needle type: Pencan  Needle gauge: 24 G Additional Notes Consent was obtained prior to the procedure with all questions answered and concerns addressed. Risks including, but not limited to, bleeding, infection, nerve damage, paralysis, failed block, inadequate analgesia, allergic reaction, high spinal, itching, and headache were discussed and the patient wished to proceed. Functioning IV was confirmed and monitors were applied. Sterile prep and drape, including Mcanelly hygiene, mask, and sterile gloves were used. The patient was positioned and the spine was prepped. The skin was anesthetized with lidocaine. Free flow of clear CSF was obtained prior to injecting local anesthetic into the CSF. The spinal needle aspirated freely following injection. The needle was carefully withdrawn. The patient tolerated the procedure well.   Renold Don, MD

## 2020-01-22 NOTE — Transfer of Care (Signed)
Immediate Anesthesia Transfer of Care Note  Patient: Susan Webster  Procedure(s) Performed: Procedure(s) with comments: LEFT TOTAL KNEE ARTHROPLASTY, RIGHT KNEE CORTISONE INJECTION (Left) - 74 MINS  Patient Location: PACU  Anesthesia Type:MAC, Regional and Spinal  Level of Consciousness: Patient easily awoken, sedated, comfortable, cooperative, following commands, responds to stimulation.   Airway & Oxygen Therapy: Patient spontaneously breathing, ventilating well, oxygen via simple oxygen mask.  Post-op Assessment: Report given to PACU RN, vital signs reviewed and stable.  Post vital signs: Reviewed and stable.  Complications: No apparent anesthesia complications Last Vitals:  Vitals Value Taken Time  BP 105/53 01/22/20 1041  Temp    Pulse 72 01/22/20 1043  Resp 11 01/22/20 1043  SpO2 100 % 01/22/20 1043  Vitals shown include unvalidated device data.  Last Pain:  Vitals:   01/22/20 0718  TempSrc: Oral         Complications: No apparent anesthesia complications

## 2020-01-22 NOTE — Evaluation (Signed)
Physical Therapy Evaluation Patient Details Name: Susan Webster MRN: [id number] DOB: 29-Jul-1946 Today's Date: 01/22/2020   History of Present Illness  Patient is 74 y.o. female s/p Lt TKA on 01/22/20 with PMH significant for HTN, HLD, GERD, fibromyalgia, history of COVID-19, CKD, OA.    Clinical Impression  ITZABELLA RAFUSE is a 74 y.o. female POD 0 s/p Lt TKA. Patient report independence with occasional use of SPC for mobility at baseline. Patient is now limited by functional impairments (see PT problem list below) and requires min assist for transfers and gait with RW. Patient was able to ambulate ~50 feet with RW and min assist. Patient instructed in exercise to facilitate ROM and circulation. Patient will benefit from continued skilled PT interventions to address impairments and progress towards PLOF. Acute PT will follow to progress mobility and stair training in preparation for safe discharge home.     Follow Up Recommendations Follow surgeon's recommendation for DC plan and follow-up therapies    Equipment Recommendations  3in1 (PT)    Recommendations for Other Services       Precautions / Restrictions Precautions Precautions: Fall Restrictions Weight Bearing Restrictions: No      Mobility  Bed Mobility Overal bed mobility: Needs Assistance Bed Mobility: Supine to Sit;Sit to Supine     Supine to sit: HOB elevated;Min assist;Min guard Sit to supine: Min assist;HOB elevated   General bed mobility comments: educated pt on use of gait belt to assist with Lt LE mobility, assist required to to rasie trunk up fully. pt required min asssit for raising Lt LE into bed due to pain.  Transfers Overall transfer level: Needs assistance Equipment used: Rolling walker (2 wheeled) Transfers: Sit to/from Stand Sit to Stand: Min guard         General transfer comment: pt performed sit<>stand from EOB and BSC, min guard and cues for safe Wion placement and technique required  initially. pt wtih slight buckling of Lt Knee noted but able to unweight Lt LE with support on RW to prevent buckling.  Ambulation/Gait Ambulation/Gait assistance: Min assist;Min guard Gait Distance (Feet): 50 Feet Assistive device: Rolling walker (2 wheeled) Gait Pattern/deviations: Step-to pattern;Decreased weight shift to left;Decreased stance time - left;Decreased stride length;Decreased step length - right Gait velocity: decreased   General Gait Details: min assist and blocking at Lt knee initially to facilitate extension and prevent buckling, pt improved ability to reduce Lt LE weigth bearing during gait to prevent knee from flexing. No overt LOB noted. assist for positioning RW for turn required.  Stairs            Wheelchair Mobility    Modified Rankin (Stroke Patients Only)       Balance Overall balance assessment: Needs assistance Sitting-balance support: Feet supported Sitting balance-Leahy Scale: Good     Standing balance support: During functional activity;Bilateral upper extremity supported Standing balance-Leahy Scale: Fair              Pertinent Vitals/Pain Pain Assessment: 0-10 Pain Score: 8  Pain Location: Lt knee Pain Descriptors / Indicators: Aching;Sore;Sharp Pain Intervention(s): Limited activity within patient's tolerance;Monitored during session    Home Living Family/patient expects to be discharged to:: Private residence Living Arrangements: Children Available Help at Discharge: Family(daughter will be available to assist with pt getting home and pt's sisters will provide transport to get to OPPT appts.) Type of Home: House Home Access: Stairs to enter Entrance Stairs-Rails: Left;Right;None Entrance Stairs-Number of Steps: 2 at back no rails, 4 at  front bil Faye rails Home Layout: One level Home Equipment: [place] - 2 wheels;Cane - single point      Prior Function Level of Independence: Independent         Comments: using SPC  intermittenly     Enrico Dominance   Dominant Brener: Right    Extremity/Trunk Assessment   Upper Extremity Assessment Upper Extremity Assessment: Overall WFL for tasks assessed    Lower Extremity Assessment Lower Extremity Assessment: LLE deficits/detail LLE Deficits / Details: moderate quad activation, pt unable to achieve full SLR, slight buckling noted in standing LLE Sensation: WNL LLE Coordination: WNL    Cervical / Trunk Assessment Cervical / Trunk Assessment: Normal  Communication   Communication: No difficulties  Cognition Arousal/Alertness: Awake/alert Behavior During Therapy: WFL for tasks assessed/performed Overall Cognitive Status: Within Functional Limits for tasks assessed            General Comments      Exercises Total Joint Exercises Ankle Circles/Pumps: AROM;Both;Supine;10 reps Quad Sets: AROM;5 reps;Supine;Left Heel Slides: AROM;5 reps;Supine;Left   Assessment/Plan    PT Assessment Patient needs continued PT services  PT Problem List Decreased range of motion;Decreased strength;Decreased balance;Decreased mobility;Decreased knowledge of use of DME       PT Treatment Interventions DME instruction;Functional mobility training;Balance training;Patient/family education;Gait training;Therapeutic activities;Therapeutic exercise;Stair training    PT Goals (Current goals can be found in the Care Plan section)  Acute Rehab PT Goals Patient Stated Goal: to get home and get back to gardening PT Goal Formulation: With patient Time For Goal Achievement: 01/29/20 Potential to Achieve Goals: Good    Frequency 7X/week    AM-PAC PT "6 Clicks" Mobility  Outcome Measure Help needed turning from your back to your side while in a flat bed without using bedrails?: A Little Help needed moving from lying on your back to sitting on the side of a flat bed without using bedrails?: A Little Help needed moving to and from a bed to a chair (including a wheelchair)?:  A Little Help needed standing up from a chair using your arms (e.g., wheelchair or bedside chair)?: A Little Help needed to walk in hospital room?: A Little Help needed climbing 3-5 steps with a railing? : A Lot 6 Click Score: 17    End of Session Equipment Utilized During Treatment: Gait belt Activity Tolerance: Patient tolerated treatment well Patient left: in bed;with call bell/phone within reach Nurse Communication: Mobility status PT Visit Diagnosis: Muscle weakness (generalized) (M62.81);Difficulty in walking, not elsewhere classified (R26.2)    Time: [id number] PT Time Calculation (min) (ACUTE ONLY): 46 min   Charges:   PT Evaluation $PT Eval Low Complexity: 1 Low PT Treatments $Gait Training: 8-22 mins $Therapeutic Exercise: 8-22 mins        Verner Mould, DPT Physical Therapist with [facility] [phone]  01/22/2020 4:55 PM

## 2020-01-22 NOTE — Anesthesia Procedure Notes (Signed)
Anesthesia Regional Block: Adductor canal block   Pre-Anesthetic Checklist: ,, timeout performed, Correct Patient, Correct Site, Correct Laterality, Correct Procedure, Correct Position, site marked, Risks and benefits discussed,  Surgical consent,  Pre-op evaluation,  At surgeon's request and post-op pain management  Laterality: Left  Prep: chloraprep       Needles:  Injection technique: Single-shot  Needle Type: Echogenic Needle     Needle Length: 10cm  Needle Gauge: 21     Additional Needles:   Narrative:  Start time: 01/22/2020 8:28 AM End time: 01/22/2020 8:32 AM Injection made incrementally with aspirations every 5 mL.  Performed by: Personally  Anesthesiologist: Audry Pili, MD  Additional Notes: No pain on injection. No increased resistance to injection. Injection made in 5cc increments. Good needle visualization. Patient tolerated the procedure well.

## 2020-01-22 NOTE — Interval H&P Note (Signed)
History and Physical Interval Note:  01/22/2020 7:54 AM  Susan Webster  has presented today for surgery, with the diagnosis of Left knee and right knee osteoarthritis.  The various methods of treatment have been discussed with the patient and family. After consideration of risks, benefits and other options for treatment, the patient has consented to  Procedure(s) with comments: LEFT TOTAL KNEE ARTHROPLASTY, RIGHT KNEE CORTISONE INJECTION (Left) - 70 MINS as a surgical intervention.  The patient's history has been reviewed, patient examined, no change in status, stable for surgery.  I have reviewed the patient's chart and labs.  Questions were answered to the patient's satisfaction.     Mauri Pole

## 2020-01-22 NOTE — Progress Notes (Signed)
AssistedDr. Brock with left, ultrasound guided, adductor canal block. Side rails up, monitors on throughout procedure. See vital signs in flow sheet. Tolerated Procedure well.  

## 2020-01-22 NOTE — Anesthesia Postprocedure Evaluation (Signed)
Anesthesia Post Note  Patient: Susan Webster  Procedure(s) Performed: LEFT TOTAL KNEE ARTHROPLASTY, RIGHT KNEE CORTISONE INJECTION (Left Knee)     Patient location during evaluation: PACU Anesthesia Type: Spinal Level of consciousness: awake and alert Pain management: pain level controlled Vital Signs Assessment: post-procedure vital signs reviewed and stable Respiratory status: spontaneous breathing and respiratory function stable Cardiovascular status: blood pressure returned to baseline and stable Postop Assessment: spinal receding and no apparent nausea or vomiting Anesthetic complications: no    Last Vitals:  Vitals:   01/22/20 1200 01/22/20 1215  BP: (!) 145/66 139/77  Pulse: (!) 59 63  Resp: 10 12  Temp:    SpO2: 100% 100%    Last Pain:  Vitals:   01/22/20 1200  TempSrc:   PainSc: 0-No pain                 Audry Pili

## 2020-01-23 ENCOUNTER — Encounter: Payer: Self-pay | Admitting: *Deleted

## 2020-01-23 DIAGNOSIS — I10 Essential (primary) hypertension: Secondary | ICD-10-CM | POA: Diagnosis not present

## 2020-01-23 DIAGNOSIS — M797 Fibromyalgia: Secondary | ICD-10-CM | POA: Diagnosis not present

## 2020-01-23 DIAGNOSIS — J45909 Unspecified asthma, uncomplicated: Secondary | ICD-10-CM | POA: Diagnosis not present

## 2020-01-23 DIAGNOSIS — M238X2 Other internal derangements of left knee: Secondary | ICD-10-CM | POA: Diagnosis not present

## 2020-01-23 DIAGNOSIS — E669 Obesity, unspecified: Secondary | ICD-10-CM | POA: Diagnosis present

## 2020-01-23 DIAGNOSIS — M170 Bilateral primary osteoarthritis of knee: Secondary | ICD-10-CM | POA: Diagnosis not present

## 2020-01-23 DIAGNOSIS — M25762 Osteophyte, left knee: Secondary | ICD-10-CM | POA: Diagnosis not present

## 2020-01-23 DIAGNOSIS — M25462 Effusion, left knee: Secondary | ICD-10-CM | POA: Diagnosis not present

## 2020-01-23 DIAGNOSIS — M25862 Other specified joint disorders, left knee: Secondary | ICD-10-CM | POA: Diagnosis not present

## 2020-01-23 DIAGNOSIS — M659 Synovitis and tenosynovitis, unspecified: Secondary | ICD-10-CM | POA: Diagnosis not present

## 2020-01-23 LAB — CBC
HCT: 35.9 % — ABNORMAL LOW (ref 36.0–46.0)
Hemoglobin: 11.5 g/dL — ABNORMAL LOW (ref 12.0–15.0)
MCH: 30.3 pg (ref 26.0–34.0)
MCHC: 32 g/dL (ref 30.0–36.0)
MCV: 94.7 fL (ref 80.0–100.0)
Platelets: 196 10*3/uL (ref 150–400)
RBC: 3.79 MIL/uL — ABNORMAL LOW (ref 3.87–5.11)
RDW: 12.5 % (ref 11.5–15.5)
WBC: 8.8 10*3/uL (ref 4.0–10.5)
nRBC: 0 % (ref 0.0–0.2)

## 2020-01-23 LAB — BASIC METABOLIC PANEL
Anion gap: 13 (ref 5–15)
BUN: 35 mg/dL — ABNORMAL HIGH (ref 8–23)
CO2: 23 mmol/L (ref 22–32)
Calcium: 9.4 mg/dL (ref 8.9–10.3)
Chloride: 103 mmol/L (ref 98–111)
Creatinine, Ser: 1.23 mg/dL — ABNORMAL HIGH (ref 0.44–1.00)
GFR calc Af Amer: 50 mL/min — ABNORMAL LOW (ref >60)
GFR calc non Af Amer: 43 mL/min — ABNORMAL LOW (ref >60)
Glucose, Bld: 173 mg/dL — ABNORMAL HIGH (ref 70–99)
Potassium: 4.5 mmol/L (ref 3.5–5.1)
Sodium: 139 mmol/L (ref 135–145)

## 2020-01-23 NOTE — Progress Notes (Signed)
Physical Therapy Treatment Patient Details Name: Susan Webster MRN: [id number] DOB: February 01, 1946 Today's Date: 01/23/2020    History of Present Illness Patient is 74 y.o. female s/p Lt TKA on 01/22/20 with PMH significant for HTN, HLD, GERD, fibromyalgia, history of COVID-19, CKD, OA.    PT Comments    Pt performed LE exercises and provided with HEP.  Pt ambulated in hallway and practiced safe stair technique.  Pt reports understanding and had no further questions.  Pt provided with handout for stair technique as well.  Pt to d/c home today.    Follow Up Recommendations  Follow surgeon's recommendation for DC plan and follow-up therapies     Equipment Recommendations  3in1 (PT)    Recommendations for Other Services       Precautions / Restrictions Precautions Precautions: Fall;Knee Restrictions Weight Bearing Restrictions: No    Mobility  Bed Mobility               General bed mobility comments: pt in recliner on arrival  Transfers Overall transfer level: Needs assistance Equipment used: Rolling walker (2 wheeled) Transfers: Sit to/from Stand Sit to Stand: Min guard         General transfer comment: verbal cues for [place] and LE positioning, min/guard for safety however pt did not require physical assist  Ambulation/Gait Ambulation/Gait assistance: Min guard Gait Distance (Feet): 80 Feet Assistive device: Rolling walker (2 wheeled) Gait Pattern/deviations: Step-to pattern;Decreased stance time - left;Antalgic Gait velocity: decreased   General Gait Details: verbal cues for sequence, RW positioning, step length, heel strike   Stairs Stairs: Yes Stairs assistance: Min guard Stair Management: Backwards;With walker;Step to pattern Number of Stairs: 2 General stair comments: verbal cues for sequence, safety, technique; pt reports understanding; provided with handout   Wheelchair Mobility    Modified Rankin (Stroke Patients Only)       Balance                                             Cognition Arousal/Alertness: Awake/alert Behavior During Therapy: WFL for tasks assessed/performed Overall Cognitive Status: Within Functional Limits for tasks assessed                                        Exercises Total Joint Exercises Ankle Circles/Pumps: AROM;Both;Supine;10 reps Quad Sets: AROM;Supine;Left;10 reps Short Arc Quad: AROM;Left;10 reps Heel Slides: AAROM;Left;10 reps Hip ABduction/ADduction: AAROM;Left;10 reps Straight Leg Raises: AAROM;Left;10 reps    General Comments        Pertinent Vitals/Pain Pain Assessment: 0-10 Pain Score: 4  Pain Location: Lt knee Pain Descriptors / Indicators: Aching;Sore Pain Intervention(s): Repositioned;Monitored during session;Premedicated before session;Ice applied    Home Living                      Prior Function            PT Goals (current goals can now be found in the care plan section) Progress towards PT goals: Progressing toward goals    Frequency    7X/week      PT Plan Current plan remains appropriate    Co-evaluation              AM-PAC PT "6 Clicks" Mobility   Outcome Measure  Help needed turning  from your back to your side while in a flat bed without using bedrails?: A Little Help needed moving from lying on your back to sitting on the side of a flat bed without using bedrails?: A Little Help needed moving to and from a bed to a chair (including a wheelchair)?: A Little Help needed standing up from a chair using your arms (e.g., wheelchair or bedside chair)?: A Little Help needed to walk in hospital room?: A Little Help needed climbing 3-5 steps with a railing? : A Lot 6 Click Score: 17    End of Session Equipment Utilized During Treatment: Gait belt Activity Tolerance: Patient tolerated treatment well Patient left: with call bell/phone within reach;in chair Nurse Communication: Mobility status PT  Visit Diagnosis: Muscle weakness (generalized) (M62.81);Difficulty in walking, not elsewhere classified (R26.2)     Time: [id number] PT Time Calculation (min) (ACUTE ONLY): 33 min  Charges:  $Gait Training: 8-22 mins $Therapeutic Exercise: 8-22 mins                     Arlyce Dice, DPT Acute Rehabilitation Services Office: [phone]  York Ram E 01/23/2020, 1:11 PM

## 2020-01-23 NOTE — Progress Notes (Signed)
     Subjective: 1 Day Post-Op Procedure(s) (LRB): LEFT TOTAL KNEE ARTHROPLASTY, RIGHT KNEE CORTISONE INJECTION (Left)   Patient reports pain as moderate, controlled with medication.  No reported events throughout the night. Discussed the procedure and expectations moving forward.   Objective:   VITALS:   Vitals:   01/23/20 0326 01/23/20 0534  BP: 125/71 125/63  Pulse: 73 60  Resp: 14 18  Temp: 97.8 F (36.6 C) 98.1 F (36.7 C)  SpO2: 97% 97%    Dorsiflexion/Plantar flexion intact Incision: dressing C/D/I No cellulitis present Compartment soft  LABS Recent Labs    01/23/20 0404  HGB 11.5*  HCT 35.9*  WBC 8.8  PLT 196    Recent Labs    01/23/20 0404  NA 139  K 4.5  BUN 35*  CREATININE 1.23*  GLUCOSE 173*     Assessment/Plan: 1 Day Post-Op Procedure(s) (LRB): LEFT TOTAL KNEE ARTHROPLASTY, RIGHT KNEE CORTISONE INJECTION (Left) Advance diet Up with therapy D/C IV fluids Discharge home Follow up in 2 weeks at [facility] Follow up with OLIN,Fe Okubo D in 2 weeks.  Contact information:  [facility] [address] [place] [phone]    Obese (BMI 30-39.9) Estimated body mass index is 33.66 kg/m as calculated from the following:   Height as of this encounter: 5\' 3"  (1.6 m).   Weight as of this encounter: 86.2 kg. Patient also counseled that weight may inhibit the healing process Patient counseled that losing weight will help with future health issues              Danae Orleans PA-C  [facility]  [place] [address], [address], [place], [place] [address] Phone: [phone] [email or web address] Facebook  [employer]

## 2020-01-26 DIAGNOSIS — M25562 Pain in left knee: Secondary | ICD-10-CM | POA: Diagnosis not present

## 2020-01-28 NOTE — Discharge Summary (Signed)
Physician Discharge Summary  Patient ID: [name] MRN: [id number] DOB/AGE: 12-22-1945 74 y.o.  Admit date: 01/22/2020 Discharge date: 01/23/2020   Procedures:  Procedure(s) (LRB): LEFT TOTAL KNEE ARTHROPLASTY, RIGHT KNEE CORTISONE INJECTION (Left)  Attending Physician:  Dr. Paralee Cancel   Admission Diagnoses:   Left knee primary OA / pain  Discharge Diagnoses:  Principal Problem:   S/P left TKA Active Problems:   Osteoarthritis of right knee   Obese  Past Medical History:  Diagnosis Date  . Arthritis   . Asthma   . Basal cell carcinoma    bilateral legs  . Bursitis    Right hip  . CKD (chronic kidney disease), stage III   . Complication of anesthesia    prolonged sedation after breast surgery age 22 or 78  . Dyspnea    when going up stairs  . Environmental and seasonal allergies   . FHx: BRCA2 gene positive    BRCA2 mutation in sister  . Fibromyalgia   . GERD (gastroesophageal reflux disease)    History of  . Hearing loss   . Heart murmur    slight noted occassionally  . History of COVID-19 08/2019  . History of hiatal hernia   . History of varicose veins   . Hyperlipidemia   . Hypertension   . Hypothyroid   . Migraines    Ocular, seldom  . Pneumonia   . PONV (postoperative nausea and vomiting)   . Post partum depression   . Tinnitus    Right  . Vitamin D deficiency     HPI:    Susan Webster, 74 y.o. female, has a history of pain and functional disability in the left knee due to arthritis and has failed non-surgical conservative treatments for greater than 12 weeks to includeNSAID's and/or analgesics, corticosteriod injections, activity modification and aspiraton.  Onset of symptoms was gradual, starting >10 years ago with gradually worsening course since that time. The patient noted prior procedures on the knee to include  arthroscopy on the left knee(s).  Patient currently rates pain in the left knee(s) at 10 out of 10 with activity. Patient  has night pain, worsening of pain with activity and weight bearing, pain that interferes with activities of daily living, pain with passive range of motion, crepitus and joint swelling.  Patient has evidence of periarticular osteophytes and joint space narrowing by imaging studies. There is no active infection.  Risks, benefits and expectations were discussed with the patient.  Risks including but not limited to the risk of anesthesia, blood clots, nerve damage, blood vessel damage, failure of the prosthesis, infection and up to and including death.  Patient understand the risks, benefits and expectations and wishes to proceed with surgery.   PCP: Susan Pretty, MD   Discharged Condition: good  Hospital Course:  Patient underwent the above stated procedure on 01/22/2020. Patient tolerated the procedure well and brought to the recovery room in good condition and subsequently to the floor.  POD #1 BP: 125/63 ; Pulse: 60 ; Temp: 98.1 F (36.7 C) ; Resp: 18 Patient reports pain as moderate, controlled with medication.  No reported events throughout the night. Discussed the procedure and expectations moving forward. Dorsiflexion/plantar flexion intact, incision: dressing C/D/I, no cellulitis present and compartment soft.   LABS  Basename    HGB     11.5  HCT     35.9    Discharge Exam: General appearance: alert, cooperative and no distress Extremities: Homans sign is  negative, no sign of DVT, no edema, redness or tenderness in the calves or thighs and no ulcers, gangrene or trophic changes  Disposition: Home with follow up in 2 weeks   Follow-up Information    [facility] Go on 01/26/2020.   Why: You are scheduled for a physical therapy appointment on 01-26-20 at 11:00 am.  Contact information: [place] [address] [phone]        Susan Orleans, PA-C. Go on 02/05/2020.   Specialty: Orthopedic Surgery Why: You are scheduled for a  post-operative appointment on 02-05-20 at 3:00 pm. Contact information: [address] [place] [address] [phone]           Discharge Instructions    Call MD / Call 911   Complete by: As directed    If you experience chest pain or shortness of breath, CALL 911 and be transported to the hospital emergency room.  If you develope a fever above 101 F, pus (white drainage) or increased drainage or redness at the wound, or calf pain, call your surgeon's office.   Change dressing   Complete by: As directed    Maintain surgical dressing until follow up in the clinic. If the edges start to pull up, may reinforce with tape. If the dressing is no longer working, may remove and cover with gauze and tape, but must keep the area dry and clean.  Call with any questions or concerns.   Constipation Prevention   Complete by: As directed    Drink plenty of fluids.  Prune juice may be helpful.  You may use a stool softener, such as Colace (over the counter) 100 mg twice a day.  Use MiraLax (over the counter) for constipation as needed.   Diet - low sodium heart healthy   Complete by: As directed    Discharge instructions   Complete by: As directed    Maintain surgical dressing until follow up in the clinic. If the edges start to pull up, may reinforce with tape. If the dressing is no longer working, may remove and cover with gauze and tape, but must keep the area dry and clean.  Follow up in 2 weeks at [facility]. Call with any questions or concerns.   Increase activity slowly as tolerated   Complete by: As directed    Weight bearing as tolerated with assist device (walker, cane, etc) as directed, use it as long as suggested by your surgeon or therapist, typically at least 4-6 weeks.   TED hose   Complete by: As directed    Use stockings (TED hose) for 2 weeks on both leg(s).  You may remove them at night for sleeping.      Allergies as of 01/23/2020      Reactions   Latex  Itching   Codeine Nausea And Vomiting, Rash      Medication List    STOP taking these medications   azithromycin 250 MG tablet Commonly known as: Zithromax Z-Pak   cephALEXin 500 MG capsule Commonly known as: KEFLEX   dextromethorphan 30 MG/5ML liquid Commonly known as: Delsym   HYDROcodone-acetaminophen 5-325 MG tablet Commonly known as: NORCO/VICODIN     TAKE these medications   acetaminophen 500 MG tablet Commonly known as: TYLENOL Take 2 tablets (1,000 mg total) by mouth every 8 (eight) hours. What changed:   how much to take  when to take this  reasons to take this   albuterol 108 (90  Base) MCG/ACT inhaler Commonly known as: VENTOLIN HFA Inhale 1-2 puffs into the lungs every 6 (six) hours as needed for wheezing or shortness of breath.   aspirin 81 MG chewable tablet Commonly known as: Aspirin Childrens Chew 1 tablet (81 mg total) by mouth 2 (two) times daily. Take for 4 weeks, then resume regular dose.   cetirizine 10 MG tablet Commonly known as: ZYRTEC Take 10 mg by mouth daily.   cholecalciferol 1000 units tablet Commonly known as: VITAMIN D Take 3,000 Units by mouth daily.   docusate sodium 100 MG capsule Commonly known as: Colace Take 1 capsule (100 mg total) by mouth 2 (two) times daily. What changed: when to take this   ferrous sulfate 325 (65 FE) MG tablet Commonly known as: FerrouSul Take 1 tablet (325 mg total) by mouth 3 (three) times daily with meals for 14 days.   HYDROmorphone 2 MG tablet Commonly known as: Dilaudid Take 1-2 tablets (2-4 mg total) by mouth every 4 (four) hours as needed for severe pain.   levothyroxine 75 MCG tablet Commonly known as: SYNTHROID Take 75 mcg by mouth daily before breakfast.   lisinopril-hydrochlorothiazide 20-25 MG tablet Commonly known as: ZESTORETIC Take 1 tablet by mouth daily.   Melatonin 5 MG Tabs Take 5 mg by mouth at bedtime.   methocarbamol 500 MG tablet Commonly known as: Robaxin Take  1 tablet (500 mg total) by mouth every 6 (six) hours as needed for muscle spasms.   polyethylene glycol 17 g packet Commonly known as: MIRALAX / GLYCOLAX Take 17 g by mouth 2 (two) times daily.   pravastatin 10 MG tablet Commonly known as: PRAVACHOL Take 10 mg by mouth every evening.   SUPER C COMPLEX PO Take 1 tablet by mouth daily.   TURMERIC PO Take 1,000 mg by mouth 2 (two) times daily.            Discharge Care Instructions  (From admission, onward)         Start     Ordered   01/23/20 0000  Change dressing    Comments: Maintain surgical dressing until follow up in the clinic. If the edges start to pull up, may reinforce with tape. If the dressing is no longer working, may remove and cover with gauze and tape, but must keep the area dry and clean.  Call with any questions or concerns.   01/23/20 0813           Signed: [place] Pugh. Massimiliano Rohleder   PA-C  01/28/2020, 9:31 PM

## 2020-01-29 DIAGNOSIS — M25562 Pain in left knee: Secondary | ICD-10-CM | POA: Diagnosis not present

## 2020-02-02 DIAGNOSIS — M25562 Pain in left knee: Secondary | ICD-10-CM | POA: Diagnosis not present

## 2020-02-04 DIAGNOSIS — M25562 Pain in left knee: Secondary | ICD-10-CM | POA: Diagnosis not present

## 2020-02-06 DIAGNOSIS — M25562 Pain in left knee: Secondary | ICD-10-CM | POA: Diagnosis not present

## 2020-02-09 DIAGNOSIS — M25562 Pain in left knee: Secondary | ICD-10-CM | POA: Diagnosis not present

## 2020-02-11 DIAGNOSIS — M25562 Pain in left knee: Secondary | ICD-10-CM | POA: Diagnosis not present

## 2020-02-13 DIAGNOSIS — M25562 Pain in left knee: Secondary | ICD-10-CM | POA: Diagnosis not present

## 2020-02-16 DIAGNOSIS — M25562 Pain in left knee: Secondary | ICD-10-CM | POA: Diagnosis not present

## 2020-02-18 DIAGNOSIS — M25562 Pain in left knee: Secondary | ICD-10-CM | POA: Diagnosis not present

## 2020-02-23 DIAGNOSIS — M25562 Pain in left knee: Secondary | ICD-10-CM | POA: Diagnosis not present

## 2020-02-25 DIAGNOSIS — M25562 Pain in left knee: Secondary | ICD-10-CM | POA: Diagnosis not present

## 2020-03-01 DIAGNOSIS — M25562 Pain in left knee: Secondary | ICD-10-CM | POA: Diagnosis not present

## 2020-03-03 DIAGNOSIS — M25562 Pain in left knee: Secondary | ICD-10-CM | POA: Diagnosis not present

## 2020-03-04 DIAGNOSIS — Z471 Aftercare following joint replacement surgery: Secondary | ICD-10-CM | POA: Diagnosis not present

## 2020-03-04 DIAGNOSIS — Z96652 Presence of left artificial knee joint: Secondary | ICD-10-CM | POA: Diagnosis not present

## 2020-04-14 DIAGNOSIS — N1831 Chronic kidney disease, stage 3a: Secondary | ICD-10-CM | POA: Diagnosis not present

## 2020-04-14 DIAGNOSIS — I129 Hypertensive chronic kidney disease with stage 1 through stage 4 chronic kidney disease, or unspecified chronic kidney disease: Secondary | ICD-10-CM | POA: Diagnosis not present

## 2020-04-14 DIAGNOSIS — K219 Gastro-esophageal reflux disease without esophagitis: Secondary | ICD-10-CM | POA: Diagnosis not present

## 2020-04-14 DIAGNOSIS — R82998 Other abnormal findings in urine: Secondary | ICD-10-CM | POA: Diagnosis not present

## 2020-04-15 ENCOUNTER — Other Ambulatory Visit: Payer: Self-pay | Admitting: Nephrology

## 2020-04-15 DIAGNOSIS — N1831 Chronic kidney disease, stage 3a: Secondary | ICD-10-CM

## 2020-04-22 ENCOUNTER — Ambulatory Visit
Admission: RE | Admit: 2020-04-22 | Discharge: 2020-04-22 | Disposition: A | Discharge status: Home / Self Care | Payer: Medicare HMO | Source: Ambulatory Visit | Attending: Nephrology | Admitting: Nephrology

## 2020-04-22 DIAGNOSIS — N1831 Chronic kidney disease, stage 3a: Secondary | ICD-10-CM

## 2020-04-22 DIAGNOSIS — N281 Cyst of kidney, acquired: Secondary | ICD-10-CM | POA: Diagnosis not present

## 2020-04-22 DIAGNOSIS — N189 Chronic kidney disease, unspecified: Secondary | ICD-10-CM | POA: Diagnosis not present

## 2020-05-13 DIAGNOSIS — E785 Hyperlipidemia, unspecified: Secondary | ICD-10-CM | POA: Diagnosis not present

## 2020-05-20 DIAGNOSIS — E039 Hypothyroidism, unspecified: Secondary | ICD-10-CM | POA: Diagnosis not present

## 2020-05-20 DIAGNOSIS — E785 Hyperlipidemia, unspecified: Secondary | ICD-10-CM | POA: Diagnosis not present

## 2020-05-20 DIAGNOSIS — I10 Essential (primary) hypertension: Secondary | ICD-10-CM | POA: Diagnosis not present

## 2020-05-20 DIAGNOSIS — N1831 Chronic kidney disease, stage 3a: Secondary | ICD-10-CM | POA: Diagnosis not present

## 2020-07-08 DIAGNOSIS — Z961 Presence of intraocular lens: Secondary | ICD-10-CM | POA: Diagnosis not present

## 2020-07-08 DIAGNOSIS — H40013 Open angle with borderline findings, low risk, bilateral: Secondary | ICD-10-CM | POA: Diagnosis not present

## 2020-07-08 DIAGNOSIS — D3131 Benign neoplasm of right choroid: Secondary | ICD-10-CM | POA: Diagnosis not present

## 2020-07-08 DIAGNOSIS — H1045 Other chronic allergic conjunctivitis: Secondary | ICD-10-CM | POA: Diagnosis not present

## 2020-09-24 DIAGNOSIS — Z1231 Encounter for screening mammogram for malignant neoplasm of breast: Secondary | ICD-10-CM | POA: Diagnosis not present

## 2020-10-06 DIAGNOSIS — R928 Other abnormal and inconclusive findings on diagnostic imaging of breast: Secondary | ICD-10-CM | POA: Diagnosis not present

## 2020-10-06 DIAGNOSIS — R922 Inconclusive mammogram: Secondary | ICD-10-CM | POA: Diagnosis not present

## 2020-10-06 DIAGNOSIS — N1831 Chronic kidney disease, stage 3a: Secondary | ICD-10-CM | POA: Diagnosis not present

## 2020-10-14 DIAGNOSIS — I129 Hypertensive chronic kidney disease with stage 1 through stage 4 chronic kidney disease, or unspecified chronic kidney disease: Secondary | ICD-10-CM | POA: Diagnosis not present

## 2020-10-14 DIAGNOSIS — N1831 Chronic kidney disease, stage 3a: Secondary | ICD-10-CM | POA: Diagnosis not present

## 2020-10-14 DIAGNOSIS — K219 Gastro-esophageal reflux disease without esophagitis: Secondary | ICD-10-CM | POA: Diagnosis not present

## 2020-10-14 DIAGNOSIS — N281 Cyst of kidney, acquired: Secondary | ICD-10-CM | POA: Diagnosis not present

## 2020-10-19 ENCOUNTER — Other Ambulatory Visit: Payer: Self-pay | Admitting: Nephrology

## 2020-10-25 ENCOUNTER — Other Ambulatory Visit: Payer: Self-pay | Admitting: Nephrology

## 2020-10-25 ENCOUNTER — Other Ambulatory Visit: Payer: Self-pay | Admitting: Radiology

## 2020-10-25 DIAGNOSIS — D241 Benign neoplasm of right breast: Secondary | ICD-10-CM | POA: Diagnosis not present

## 2020-10-25 DIAGNOSIS — R921 Mammographic calcification found on diagnostic imaging of breast: Secondary | ICD-10-CM | POA: Diagnosis not present

## 2020-10-25 DIAGNOSIS — N1832 Chronic kidney disease, stage 3b: Secondary | ICD-10-CM

## 2020-10-25 DIAGNOSIS — N281 Cyst of kidney, acquired: Secondary | ICD-10-CM

## 2020-11-09 ENCOUNTER — Ambulatory Visit
Admission: RE | Admit: 2020-11-09 | Discharge: 2020-11-09 | Disposition: A | Discharge status: Home / Self Care | Payer: Medicare HMO | Source: Ambulatory Visit | Attending: Nephrology | Admitting: Nephrology

## 2020-11-09 DIAGNOSIS — N281 Cyst of kidney, acquired: Secondary | ICD-10-CM

## 2020-11-09 DIAGNOSIS — N1832 Chronic kidney disease, stage 3b: Secondary | ICD-10-CM

## 2020-11-17 DIAGNOSIS — E785 Hyperlipidemia, unspecified: Secondary | ICD-10-CM | POA: Diagnosis not present

## 2020-11-17 DIAGNOSIS — N183 Chronic kidney disease, stage 3 unspecified: Secondary | ICD-10-CM | POA: Diagnosis not present

## 2020-11-17 DIAGNOSIS — I129 Hypertensive chronic kidney disease with stage 1 through stage 4 chronic kidney disease, or unspecified chronic kidney disease: Secondary | ICD-10-CM | POA: Diagnosis not present

## 2020-11-17 DIAGNOSIS — M8580 Other specified disorders of bone density and structure, unspecified site: Secondary | ICD-10-CM | POA: Diagnosis not present

## 2020-11-17 DIAGNOSIS — E538 Deficiency of other specified B group vitamins: Secondary | ICD-10-CM | POA: Diagnosis not present

## 2020-11-17 DIAGNOSIS — I10 Essential (primary) hypertension: Secondary | ICD-10-CM | POA: Diagnosis not present

## 2020-11-17 DIAGNOSIS — E039 Hypothyroidism, unspecified: Secondary | ICD-10-CM | POA: Diagnosis not present

## 2020-11-17 DIAGNOSIS — F3341 Major depressive disorder, recurrent, in partial remission: Secondary | ICD-10-CM | POA: Diagnosis not present

## 2020-11-24 DIAGNOSIS — N1831 Chronic kidney disease, stage 3a: Secondary | ICD-10-CM | POA: Diagnosis not present

## 2020-11-24 DIAGNOSIS — E039 Hypothyroidism, unspecified: Secondary | ICD-10-CM | POA: Diagnosis not present

## 2020-11-24 DIAGNOSIS — Z0000 Encounter for general adult medical examination without abnormal findings: Secondary | ICD-10-CM | POA: Diagnosis not present

## 2020-11-24 DIAGNOSIS — E785 Hyperlipidemia, unspecified: Secondary | ICD-10-CM | POA: Diagnosis not present

## 2020-11-24 DIAGNOSIS — I10 Essential (primary) hypertension: Secondary | ICD-10-CM | POA: Diagnosis not present

## 2020-12-13 DIAGNOSIS — D485 Neoplasm of uncertain behavior of skin: Secondary | ICD-10-CM | POA: Diagnosis not present

## 2020-12-13 DIAGNOSIS — D3617 Benign neoplasm of peripheral nerves and autonomic nervous system of trunk, unspecified: Secondary | ICD-10-CM | POA: Diagnosis not present

## 2020-12-13 DIAGNOSIS — L821 Other seborrheic keratosis: Secondary | ICD-10-CM | POA: Diagnosis not present

## 2020-12-13 DIAGNOSIS — D225 Melanocytic nevi of trunk: Secondary | ICD-10-CM | POA: Diagnosis not present

## 2020-12-13 DIAGNOSIS — D1801 Hemangioma of skin and subcutaneous tissue: Secondary | ICD-10-CM | POA: Diagnosis not present

## 2020-12-13 DIAGNOSIS — L820 Inflamed seborrheic keratosis: Secondary | ICD-10-CM | POA: Diagnosis not present

## 2021-01-31 DIAGNOSIS — Z96652 Presence of left artificial knee joint: Secondary | ICD-10-CM | POA: Diagnosis not present

## 2021-03-03 DIAGNOSIS — N1831 Chronic kidney disease, stage 3a: Secondary | ICD-10-CM | POA: Diagnosis not present

## 2021-03-03 DIAGNOSIS — I129 Hypertensive chronic kidney disease with stage 1 through stage 4 chronic kidney disease, or unspecified chronic kidney disease: Secondary | ICD-10-CM | POA: Diagnosis not present

## 2021-03-03 DIAGNOSIS — E039 Hypothyroidism, unspecified: Secondary | ICD-10-CM | POA: Diagnosis not present

## 2021-03-03 DIAGNOSIS — E785 Hyperlipidemia, unspecified: Secondary | ICD-10-CM | POA: Diagnosis not present

## 2021-04-02 DIAGNOSIS — E039 Hypothyroidism, unspecified: Secondary | ICD-10-CM | POA: Diagnosis not present

## 2021-04-02 DIAGNOSIS — N1831 Chronic kidney disease, stage 3a: Secondary | ICD-10-CM | POA: Diagnosis not present

## 2021-04-02 DIAGNOSIS — I129 Hypertensive chronic kidney disease with stage 1 through stage 4 chronic kidney disease, or unspecified chronic kidney disease: Secondary | ICD-10-CM | POA: Diagnosis not present

## 2021-04-02 DIAGNOSIS — E785 Hyperlipidemia, unspecified: Secondary | ICD-10-CM | POA: Diagnosis not present

## 2021-05-03 DIAGNOSIS — E785 Hyperlipidemia, unspecified: Secondary | ICD-10-CM | POA: Diagnosis not present

## 2021-05-03 DIAGNOSIS — I129 Hypertensive chronic kidney disease with stage 1 through stage 4 chronic kidney disease, or unspecified chronic kidney disease: Secondary | ICD-10-CM | POA: Diagnosis not present

## 2021-05-03 DIAGNOSIS — E039 Hypothyroidism, unspecified: Secondary | ICD-10-CM | POA: Diagnosis not present

## 2021-05-03 DIAGNOSIS — N1831 Chronic kidney disease, stage 3a: Secondary | ICD-10-CM | POA: Diagnosis not present

## 2021-05-23 DIAGNOSIS — I10 Essential (primary) hypertension: Secondary | ICD-10-CM | POA: Diagnosis not present

## 2021-05-23 DIAGNOSIS — Z0000 Encounter for general adult medical examination without abnormal findings: Secondary | ICD-10-CM | POA: Diagnosis not present

## 2021-05-26 DIAGNOSIS — I10 Essential (primary) hypertension: Secondary | ICD-10-CM | POA: Diagnosis not present

## 2021-05-26 DIAGNOSIS — N1831 Chronic kidney disease, stage 3a: Secondary | ICD-10-CM | POA: Diagnosis not present

## 2021-05-26 DIAGNOSIS — E039 Hypothyroidism, unspecified: Secondary | ICD-10-CM | POA: Diagnosis not present

## 2021-05-26 DIAGNOSIS — E785 Hyperlipidemia, unspecified: Secondary | ICD-10-CM | POA: Diagnosis not present

## 2021-07-03 DIAGNOSIS — N1831 Chronic kidney disease, stage 3a: Secondary | ICD-10-CM | POA: Diagnosis not present

## 2021-07-03 DIAGNOSIS — E039 Hypothyroidism, unspecified: Secondary | ICD-10-CM | POA: Diagnosis not present

## 2021-07-03 DIAGNOSIS — I129 Hypertensive chronic kidney disease with stage 1 through stage 4 chronic kidney disease, or unspecified chronic kidney disease: Secondary | ICD-10-CM | POA: Diagnosis not present

## 2021-07-03 DIAGNOSIS — E785 Hyperlipidemia, unspecified: Secondary | ICD-10-CM | POA: Diagnosis not present

## 2021-07-11 DIAGNOSIS — H1045 Other chronic allergic conjunctivitis: Secondary | ICD-10-CM | POA: Diagnosis not present

## 2021-07-11 DIAGNOSIS — D3131 Benign neoplasm of right choroid: Secondary | ICD-10-CM | POA: Diagnosis not present

## 2021-07-11 DIAGNOSIS — Z961 Presence of intraocular lens: Secondary | ICD-10-CM | POA: Diagnosis not present

## 2021-07-11 DIAGNOSIS — H40013 Open angle with borderline findings, low risk, bilateral: Secondary | ICD-10-CM | POA: Diagnosis not present

## 2021-10-04 DIAGNOSIS — R928 Other abnormal and inconclusive findings on diagnostic imaging of breast: Secondary | ICD-10-CM | POA: Diagnosis not present

## 2021-10-26 DIAGNOSIS — N1831 Chronic kidney disease, stage 3a: Secondary | ICD-10-CM | POA: Diagnosis not present

## 2021-11-03 DIAGNOSIS — N281 Cyst of kidney, acquired: Secondary | ICD-10-CM | POA: Diagnosis not present

## 2021-11-03 DIAGNOSIS — I129 Hypertensive chronic kidney disease with stage 1 through stage 4 chronic kidney disease, or unspecified chronic kidney disease: Secondary | ICD-10-CM | POA: Diagnosis not present

## 2021-11-03 DIAGNOSIS — N1831 Chronic kidney disease, stage 3a: Secondary | ICD-10-CM | POA: Diagnosis not present

## 2021-11-03 DIAGNOSIS — K219 Gastro-esophageal reflux disease without esophagitis: Secondary | ICD-10-CM | POA: Diagnosis not present

## 2021-11-25 DIAGNOSIS — I10 Essential (primary) hypertension: Secondary | ICD-10-CM | POA: Diagnosis not present

## 2021-11-25 DIAGNOSIS — E785 Hyperlipidemia, unspecified: Secondary | ICD-10-CM | POA: Diagnosis not present

## 2021-11-25 DIAGNOSIS — E039 Hypothyroidism, unspecified: Secondary | ICD-10-CM | POA: Diagnosis not present

## 2021-11-25 DIAGNOSIS — E538 Deficiency of other specified B group vitamins: Secondary | ICD-10-CM | POA: Diagnosis not present

## 2021-11-25 DIAGNOSIS — E559 Vitamin D deficiency, unspecified: Secondary | ICD-10-CM | POA: Diagnosis not present

## 2021-11-25 DIAGNOSIS — Z0000 Encounter for general adult medical examination without abnormal findings: Secondary | ICD-10-CM | POA: Diagnosis not present

## 2021-12-06 DIAGNOSIS — E785 Hyperlipidemia, unspecified: Secondary | ICD-10-CM | POA: Diagnosis not present

## 2021-12-06 DIAGNOSIS — Z23 Encounter for immunization: Secondary | ICD-10-CM | POA: Diagnosis not present

## 2021-12-06 DIAGNOSIS — I10 Essential (primary) hypertension: Secondary | ICD-10-CM | POA: Diagnosis not present

## 2021-12-06 DIAGNOSIS — E559 Vitamin D deficiency, unspecified: Secondary | ICD-10-CM | POA: Diagnosis not present

## 2021-12-06 DIAGNOSIS — Z0000 Encounter for general adult medical examination without abnormal findings: Secondary | ICD-10-CM | POA: Diagnosis not present

## 2021-12-06 DIAGNOSIS — N1831 Chronic kidney disease, stage 3a: Secondary | ICD-10-CM | POA: Diagnosis not present

## 2021-12-06 DIAGNOSIS — E039 Hypothyroidism, unspecified: Secondary | ICD-10-CM | POA: Diagnosis not present

## 2021-12-06 DIAGNOSIS — F3341 Major depressive disorder, recurrent, in partial remission: Secondary | ICD-10-CM | POA: Diagnosis not present

## 2021-12-12 ENCOUNTER — Other Ambulatory Visit: Payer: Self-pay | Admitting: Registered Nurse

## 2021-12-12 DIAGNOSIS — Z8249 Family history of ischemic heart disease and other diseases of the circulatory system: Secondary | ICD-10-CM

## 2021-12-13 DIAGNOSIS — M25511 Pain in right shoulder: Secondary | ICD-10-CM | POA: Diagnosis not present

## 2021-12-13 DIAGNOSIS — M545 Low back pain, unspecified: Secondary | ICD-10-CM | POA: Diagnosis not present

## 2022-01-09 ENCOUNTER — Ambulatory Visit
Admission: RE | Admit: 2022-01-09 | Discharge: 2022-01-09 | Disposition: A | Discharge status: Home / Self Care | Payer: No Typology Code available for payment source | Source: Ambulatory Visit | Attending: Registered Nurse | Admitting: Registered Nurse

## 2022-01-09 DIAGNOSIS — Z8249 Family history of ischemic heart disease and other diseases of the circulatory system: Secondary | ICD-10-CM

## 2022-01-09 DIAGNOSIS — R911 Solitary pulmonary nodule: Secondary | ICD-10-CM | POA: Diagnosis not present

## 2022-01-18 DIAGNOSIS — Z1212 Encounter for screening for malignant neoplasm of rectum: Secondary | ICD-10-CM | POA: Diagnosis not present

## 2022-01-18 DIAGNOSIS — Z1211 Encounter for screening for malignant neoplasm of colon: Secondary | ICD-10-CM | POA: Diagnosis not present

## 2022-01-25 LAB — COLOGUARD: COLOGUARD: POSITIVE — AB

## 2022-02-08 ENCOUNTER — Encounter: Payer: Self-pay | Admitting: Cardiology

## 2022-02-08 NOTE — Progress Notes (Signed)
?  ?Cardiology Office Note ? ? ?Date:  02/09/2022  ? ?ID:  Susan Webster, DOB [birth year], MRN [id number] ? ?PCP:  Deland Pretty, MD  ?Cardiologist:   None ?Referring:  Deland Pretty, MD ? ?Chief Complaint  ?Patient presents with  ? Elevated Coronary Calcium  ? ? ?  ?History of Present Illness: ?Susan Webster is a 76 y.o. female who presents for elevated coronary calcium.  This was found on routine screening.  Coronary calcium was 115 which was 61st percentile.  Currently cardiac history is a distant stress test.  She does have some dyslipidemia and hypertension.  She actually has a strong family history.  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  This has been mildly progressive.  She does not describe resting shortness of breath, PND or orthopnea.  She does not have palpitations, presyncope or syncope.  She denies any chest pressure, neck or arm discomfort.  She has had no edema. ? ? ?Past Medical History:  ?Diagnosis Date  ? Arthritis   ? Asthma   ? Basal cell carcinoma   ? bilateral legs  ? Bursitis   ? Right hip  ? CKD (chronic kidney disease), stage III ([place])   ? Complication of anesthesia   ? prolonged sedation after breast surgery age 25 or 9  ? Environmental and seasonal allergies   ? FHx: BRCA2 gene positive   ? BRCA2 mutation in sister  ? Fibromyalgia   ? GERD (gastroesophageal reflux disease)   ? History of  ? Hearing loss   ? History of COVID-19 08/2019  ? History of hiatal hernia   ? History of varicose veins   ? Hyperlipidemia   ? Hypertension   ? Hypothyroid   ? Migraines   ? Ocular, seldom  ? PONV (postoperative nausea and vomiting)   ? Post partum depression   ? Tinnitus   ? Right  ? Vitamin D deficiency   ? ? ?Past Surgical History:  ?Procedure Laterality Date  ? ABDOMINAL HYSTERECTOMY    ? ANKLE FRACTURE SURGERY Right   ? BREAST LUMPECTOMY Right   ? CERVIX LESION DESTRUCTION    ? COLONOSCOPY    ? ELBOW SURGERY Left    ? EYE SURGERY Bilateral   ? Corneal Surgery  ? TMJ ARTHROSCOPY    ? TOTAL KNEE ARTHROPLASTY Left 01/22/2020  ? Procedure: LEFT TOTAL KNEE ARTHROPLASTY, RIGHT KNEE CORTISONE INJECTION;  Surgeon: Paralee Cancel, MD;  Location: [facility];  Service: Orthopedics;  Laterality: Left;  70 MINS  ? UPPER GI ENDOSCOPY    ? VARICOSE VEIN SURGERY    ? ? ? ?Current Outpatient Medications  ?Medication Sig Dispense Refill  ? albuterol (VENTOLIN HFA) 108 (90 Base) MCG/ACT inhaler Inhale 1-2 puffs into the lungs every 6 (six) hours as needed for wheezing or shortness of breath.    ? Ascorbic Acid (SUPER C COMPLEX PO) Take 1 tablet by mouth daily.    ? cetirizine (ZYRTEC) 10 MG tablet Take 10 mg by mouth daily.      ? cholecalciferol (VITAMIN D) 1000 UNITS tablet Take 3,000 Units by mouth daily.     ? hydrochlorothiazide (HYDRODIURIL) 12.5 MG tablet hydrochlorothiazide 12.5 mg tablet    ? levothyroxine (SYNTHROID, LEVOTHROID) 75 MCG tablet Take 75 mcg by mouth daily before breakfast.     ? lisinopril (ZESTRIL) 20 MG tablet 1 tablet    ? pravastatin (  PRAVACHOL) 20 MG tablet Take 1 tablet (20 mg total) by mouth every evening. 90 tablet 3  ? ?No current facility-administered medications for this visit.  ? ? ?Allergies:   Latex and Codeine  ? ? ?Social History:  The patient  reports that she has never smoked. She has never used smokeless tobacco. She reports current alcohol use. She reports that she does not currently use drugs after having used the following drugs: Marijuana.  ? ?Family History:  The patient's family history includes Breast cancer in an other family member; Breast cancer (age of onset: 4) in her sister; Breast cancer (age of onset: 76) in her sister; Cancer in her maternal grandmother and maternal uncle; Heart disease (age of onset: 39) in her mother.  ? ? ?ROS:  Please see the history of present illness.   Otherwise, review of systems are positive for none.   All other systems are reviewed and negative.  ? ? ?PHYSICAL  EXAM: ?VS:  BP (!) 160/72   Pulse 68   Ht '5\' 4"'  (1.626 m)   Wt 199 lb 12.8 oz (90.6 kg)   SpO2 99%   BMI 34.30 kg/m?  , BMI Body mass index is 34.3 kg/m?. ?GENERAL:  Well appearing ?HEENT:  Pupils equal round and reactive, fundi not visualized, oral mucosa unremarkable ?NECK:  No jugular venous distention, waveform within normal limits, carotid upstroke brisk and symmetric, no bruits, no thyromegaly ?LYMPHATICS:  No cervical, inguinal adenopathy ?LUNGS:  Clear to auscultation bilaterally ?BACK:  No CVA tenderness ?CHEST:  Unremarkable ?HEART:  PMI not displaced or sustained,S1 and S2 within normal limits, no S3, no S4, no clicks, no rubs, no murmurs ?ABD:  Flat, positive bowel sounds normal in frequency in pitch, no bruits, no rebound, no guarding, no midline pulsatile mass, no hepatomegaly, no splenomegaly ?EXT:  2 plus pulses throughout, no edema, no cyanosis no clubbing ?SKIN:  No rashes no nodules ?NEURO:  Cranial nerves II through XII grossly intact, motor grossly intact throughout ?PSYCH:  Cognitively intact, oriented to person place and time ? ? ? ?EKG:  EKG is ordered today. ?The ekg ordered today demonstrates sinus rhythm, rate 68, axis within normal limits, intervals within normal limits, poor anterior R wave progression, no acute ST-T wave changes. ? ? ?Recent Labs: ?No results found for requested labs within last 8760 hours.  ? ? ?Lipid Panel ?No results found for: CHOL, TRIG, HDL, CHOLHDL, VLDL, LDLCALC, LDLDIRECT ?  ? ?Wt Readings from Last 3 Encounters:  ?02/09/22 199 lb 12.8 oz (90.6 kg)  ?01/22/20 190 lb (86.2 kg)  ?01/19/20 193 lb 1 oz (87.6 kg)  ?  ? ? ?Other studies Reviewed: ?Additional studies/ records that were reviewed today include: Labs, primary care office notes. ?Review of the above records demonstrates:  Please see elsewhere in the note.   ? ? ?ASSESSMENT AND PLAN: ? ?Elevated coronary calcium:   We had a long discussion about primary prevention.  She does have a little bit of  shortness of breath.I will bring the patient back for a POET (Plain Old Exercise Test). This will allow me to screen for obstructive coronary disease, risk stratify and very importantly provide a prescription for exercise.   ? ?HTN: Her blood pressure is not at target we talked about keeping a blood pressure diary.  I would have a low threshold to increase her meds if she is not routinely in the 3/78/5885 systolic range. ? ?Dyslipidemia: Her LDL is not quite at target at 105.  Her MESA score was 11.5.  I suggested an LDL of 70 goal and she agrees to increase her pravastatin to 20 mg daily.  We had a very long conversation about exercise and a antioxidant diet and I suggested Mediterranean plant-based. ? ?Current medicines are reviewed at length with the patient today.  The patient does not have concerns regarding medicines. ? ?The following changes have been made:  no change ? ?Labs/ tests ordered today include:  ? ?Orders Placed This Encounter  ?Procedures  ? Lipid panel  ? Hepatic function panel  ? Cardiac Stress Test: Informed Consent Details: Physician/Practitioner Attestation; Transcribe to consent form and obtain patient signature  ? Exercise Tolerance Test  ? EKG 12-Lead  ? ? ? ?Disposition:   FU with as needed based on results of the above ? ? ?Signed, ?Minus Breeding, MD  ?02/09/2022 11:49 AM    ?[place] ? ? ? ?

## 2022-02-09 ENCOUNTER — Ambulatory Visit: Payer: Medicare HMO | Admitting: Cardiology

## 2022-02-09 ENCOUNTER — Other Ambulatory Visit: Payer: Self-pay

## 2022-02-09 ENCOUNTER — Encounter: Payer: Self-pay | Admitting: Cardiology

## 2022-02-09 VITALS — BP 160/72 | HR 68 | Ht 64.0 in | Wt 199.8 lb

## 2022-02-09 DIAGNOSIS — Z5181 Encounter for therapeutic drug level monitoring: Secondary | ICD-10-CM | POA: Diagnosis not present

## 2022-02-09 DIAGNOSIS — R931 Abnormal findings on diagnostic imaging of heart and coronary circulation: Secondary | ICD-10-CM | POA: Diagnosis not present

## 2022-02-09 DIAGNOSIS — R0602 Shortness of breath: Secondary | ICD-10-CM

## 2022-02-09 DIAGNOSIS — I10 Essential (primary) hypertension: Secondary | ICD-10-CM

## 2022-02-09 MED ORDER — PRAVASTATIN SODIUM 20 MG PO TABS: 20.0000 mg | ORAL_TABLET | Freq: Every evening | ORAL | 3 refills | Status: DC

## 2022-02-09 MED ORDER — PRAVASTATIN SODIUM 20 MG PO TABS: 20.0000 mg | ORAL_TABLET | Freq: Every evening | ORAL | 3 refills | Status: AC

## 2022-02-09 NOTE — Patient Instructions (Signed)
Medication Instructions:  ?INCREASE PRAVASTATIN TO 20 MG DAILY  ? ?Labwork: ?FASTING LP/HFP IN 2-3 MONTHS  ? ?Testing/Procedures: ?Your physician has requested that you have an exercise tolerance test. For further information please visit [email or web address]. Please also follow instruction sheet, as given. ? ?Follow-Up: ?AS NEEDED  ? ?  ? ?

## 2022-02-14 DIAGNOSIS — R911 Solitary pulmonary nodule: Secondary | ICD-10-CM | POA: Diagnosis not present

## 2022-02-14 DIAGNOSIS — I2510 Atherosclerotic heart disease of native coronary artery without angina pectoris: Secondary | ICD-10-CM | POA: Diagnosis not present

## 2022-02-15 ENCOUNTER — Encounter (HOSPITAL_COMMUNITY): Payer: Medicare HMO

## 2022-02-15 ENCOUNTER — Telehealth (HOSPITAL_COMMUNITY): Payer: Self-pay | Admitting: *Deleted

## 2022-02-15 NOTE — Telephone Encounter (Signed)
Close encounter 

## 2022-02-16 ENCOUNTER — Ambulatory Visit (HOSPITAL_COMMUNITY)
Admission: RE | Admit: 2022-02-16 | Discharge: 2022-02-16 | Disposition: A | Discharge status: Home / Self Care | Payer: Medicare HMO | Source: Ambulatory Visit | Attending: Cardiology | Admitting: Cardiology

## 2022-02-16 ENCOUNTER — Other Ambulatory Visit: Payer: Self-pay

## 2022-02-16 DIAGNOSIS — R0602 Shortness of breath: Secondary | ICD-10-CM

## 2022-02-16 DIAGNOSIS — I10 Essential (primary) hypertension: Secondary | ICD-10-CM | POA: Diagnosis not present

## 2022-02-16 DIAGNOSIS — R931 Abnormal findings on diagnostic imaging of heart and coronary circulation: Secondary | ICD-10-CM

## 2022-02-16 LAB — EXERCISE TOLERANCE TEST
Angina Index: 0
Duke Treadmill Score: 6
Estimated workload: 7.1
Exercise duration (min): 6 min
Exercise duration (sec): 6 s
MPHR: 144 {beats}/min
Peak HR: 153 {beats}/min
Percent HR: 106 %
Rest HR: 61 {beats}/min
ST Depression (mm): 0.5 mm

## 2022-02-21 DIAGNOSIS — R635 Abnormal weight gain: Secondary | ICD-10-CM | POA: Diagnosis not present

## 2022-02-21 DIAGNOSIS — K5900 Constipation, unspecified: Secondary | ICD-10-CM | POA: Diagnosis not present

## 2022-02-21 DIAGNOSIS — Z1211 Encounter for screening for malignant neoplasm of colon: Secondary | ICD-10-CM | POA: Diagnosis not present

## 2022-03-08 DIAGNOSIS — Z1211 Encounter for screening for malignant neoplasm of colon: Secondary | ICD-10-CM | POA: Diagnosis not present

## 2022-03-08 DIAGNOSIS — R195 Other fecal abnormalities: Secondary | ICD-10-CM | POA: Diagnosis not present

## 2022-03-08 DIAGNOSIS — K5730 Diverticulosis of large intestine without perforation or abscess without bleeding: Secondary | ICD-10-CM | POA: Diagnosis not present

## 2022-04-19 ENCOUNTER — Other Ambulatory Visit: Payer: Self-pay | Admitting: Registered Nurse

## 2022-04-19 DIAGNOSIS — R911 Solitary pulmonary nodule: Secondary | ICD-10-CM

## 2022-04-26 ENCOUNTER — Encounter (HOSPITAL_COMMUNITY): Payer: Self-pay | Admitting: Emergency Medicine

## 2022-04-26 ENCOUNTER — Ambulatory Visit (HOSPITAL_COMMUNITY)
Admission: EM | Admit: 2022-04-26 | Discharge: 2022-04-26 | Disposition: A | Discharge status: Home / Self Care | Payer: Medicare HMO | Attending: Emergency Medicine | Admitting: Emergency Medicine

## 2022-04-26 ENCOUNTER — Other Ambulatory Visit: Payer: Self-pay

## 2022-04-26 DIAGNOSIS — S0101XA Laceration without foreign body of scalp, initial encounter: Secondary | ICD-10-CM | POA: Diagnosis not present

## 2022-04-26 MED ORDER — LIDOCAINE-EPINEPHRINE 1 %-1:100000 IJ SOLN
INTRAMUSCULAR | Status: AC
  Filled 2022-04-26: qty 1

## 2022-04-26 NOTE — ED Provider Notes (Signed)
[name]    CSN: [id number] Arrival date & time: 04/26/22  1527     History   Chief Complaint Chief Complaint  Patient presents with   Fall    HPI Susan Webster is a 76 y.o. female.  [place] when she fell.  First hit her knee then her right forearm, then her forehead hit a wooden sign post.  Bleeding is controlled.  She is not on any blood thinners. Denies loss of consciousness, headache, vision changes, dizziness or lightheadedness.  She has some muscle stiffness but denies pain. Full ROM in all extremities.  Past Medical History:  Diagnosis Date   Arthritis    Asthma    Basal cell carcinoma    bilateral legs   Bursitis    Right hip   CKD (chronic kidney disease), stage III (HCC)    Complication of anesthesia    prolonged sedation after breast surgery age 22 or 16   Environmental and seasonal allergies    FHx: BRCA2 gene positive    BRCA2 mutation in sister   Fibromyalgia    GERD (gastroesophageal reflux disease)    History of   Hearing loss    History of COVID-19 08/2019   History of hiatal hernia    History of varicose veins    Hyperlipidemia    Hypertension    Hypothyroid    Migraines    Ocular, seldom   PONV (postoperative nausea and vomiting)    Post partum depression    Tinnitus    Right   Vitamin D deficiency     Patient Active Problem List   Diagnosis Date Noted   Obese 01/23/2020   S/P left TKA 01/22/2020   Osteoarthritis of right knee 01/22/2020   FHx: BRCA2 gene positive     Past Surgical History:  Procedure Laterality Date   ABDOMINAL HYSTERECTOMY     ANKLE FRACTURE SURGERY Right    BREAST LUMPECTOMY Right    CERVIX LESION DESTRUCTION     COLONOSCOPY     ELBOW SURGERY Left    EYE SURGERY Bilateral    Corneal Surgery   TMJ ARTHROSCOPY     TOTAL KNEE ARTHROPLASTY Left 01/22/2020   Procedure: LEFT TOTAL KNEE ARTHROPLASTY, RIGHT KNEE CORTISONE INJECTION;   Surgeon: Paralee Cancel, MD;  Location: [facility];  Service: Orthopedics;  Laterality: Left;  80 MINS   UPPER GI ENDOSCOPY     VARICOSE VEIN SURGERY      OB History   No obstetric history on file.      Home Medications    Prior to Admission medications   Medication Sig Start Date End Date Taking? Authorizing Provider  albuterol (VENTOLIN HFA) 108 (90 Base) MCG/ACT inhaler Inhale 1-2 puffs into the lungs every 6 (six) hours as needed for wheezing or shortness of breath.    [provider]  Ascorbic Acid (SUPER C COMPLEX PO) Take 1 tablet by mouth daily.    [provider]  cetirizine (ZYRTEC) 10 MG tablet Take 10 mg by mouth daily.      [provider]  cholecalciferol (VITAMIN D) 1000 UNITS tablet Take 3,000 Units by mouth daily.     [provider]  hydrochlorothiazide (HYDRODIURIL) 12.5 MG tablet hydrochlorothiazide 12.5 mg tablet 02/09/22   [provider]  levothyroxine (SYNTHROID, LEVOTHROID) 75 MCG tablet Take 75 mcg by mouth daily before breakfast.     [provider]  lisinopril (  ZESTRIL) 20 MG tablet 1 tablet    [provider]  pravastatin (PRAVACHOL) 20 MG tablet Take 1 tablet (20 mg total) by mouth every evening. 02/09/22   Hochrein, James, MD    Family History Family History  Problem Relation Age of Onset   Heart disease Mother 55       CAD   Breast cancer Sister 38       Recurrence at 58; BRCA2 mutation   Breast cancer Sister 44       Currently 67; genetic testing pending   Cancer Maternal Grandmother        possible stomach cancer; deceased 50s   Cancer Maternal Uncle        head/neck ca 60s   Breast cancer Other        pat grandmother's sister    Social History Social History   Tobacco Use   Smoking status: Never   Smokeless tobacco: Never  Vaping Use   Vaping Use: Never used  Substance Use Topics   Alcohol use: Yes    Comment: seldom   Drug use: Not Currently    Types: Marijuana     Allergies   Latex and Codeine   Review of Systems Review of Systems  As per HPI  Physical Exam Triage Vital Signs ED Triage Vitals  Enc Vitals Group     BP 04/26/22 1619 (!) 168/99     Pulse Rate 04/26/22 1619 85     Resp 04/26/22 1619 20     Temp 04/26/22 1619 98.3 F (36.8 C)     Temp Source 04/26/22 1619 Oral     SpO2 04/26/22 1619 96 %     Weight --      Height --      Head Circumference --      Peak Flow --      Pain Score 04/26/22 1616 3     Pain Loc --      Pain Edu? --      Excl. in GC? --    No data found.  Updated Vital Signs BP (!) 168/99 (BP Location: Right Arm) Comment (BP Location): large cuff  Pulse 85   Temp 98.3 F (36.8 C) (Oral)   Resp 20   SpO2 96%   BP recheck 158/60   Physical Exam Vitals and nursing note reviewed.  Constitutional:      General: She is not in acute distress. HENT:     Head: Normocephalic and atraumatic.     Mouth/Throat:     Pharynx: Oropharynx is clear.  Eyes:     Extraocular Movements: Extraocular movements intact.     Pupils: Pupils are equal, round, and reactive to light.  Cardiovascular:     Rate and Rhythm: Normal rate and regular rhythm.     Heart sounds: Normal heart sounds.  Pulmonary:     Effort: Pulmonary effort is normal. No respiratory distress.     Breath sounds: Normal breath sounds.  Musculoskeletal:        General: Normal range of motion.     Cervical back: Normal range of motion. No tenderness.     Comments: Normal ROM all extremities. No bony tenderness to right ankle, knee, hip, elbow, shoulder  Skin:    Findings: Abrasion and wound present.     Comments: 2 cm abrasion to right knee, scattered superficial abrasions to right anterior forearm, 2 inch lac to right forehead superior to eyebrow. Bleeding controlled  Neurological:       General: No focal deficit present.     Mental Status: She is alert and oriented to person, place, and time.     Cranial Nerves: Cranial nerves 2-12 are intact.  No facial asymmetry.     Sensory: Sensation is intact. No sensory deficit.     Motor: Motor function is intact. No weakness.     Coordination: Coordination is intact. Coordination normal. Finger-Nose-Finger Test normal.     Gait: Gait is intact. Gait normal.    UC Treatments / Results  Labs (all labs ordered are listed, but only abnormal results are displayed) Labs Reviewed - No data to display  EKG  Radiology No results found.  Procedures Laceration Repair  Date/Time: 04/26/2022 5:51 PM Performed by: , , PA-C Authorized by: , , PA-C   Consent:    Consent obtained:  Verbal   Consent given by:  Patient Anesthesia:    Anesthesia method:  Local infiltration   Local anesthetic:  Lidocaine 1% WITH epi Laceration details:    Location:  Face   Face location:  Forehead Treatment:    Area cleansed with:  Saline and povidone-iodine   Irrigation solution:  Sterile saline Skin repair:    Repair method:  Sutures   Suture size:  6-0   Suture material:  Prolene   Suture technique:  Simple interrupted   Number of sutures:  4 Approximation:    Approximation:  Close Post-procedure details:    Dressing:  Open (no dressing)   Procedure completion:  Tolerated  Medications Ordered in UC Medications - No data to display  Initial Impression / Assessment and Plan / UC Course  I have reviewed the triage vital signs and the nursing notes.  Pertinent labs & imaging results that were available during my care of the patient were reviewed by me and considered in my medical decision making (see chart for details).  Physical exam does not warrant imaging at this time. Neurologically intact with no focal deficit - remainder of physical exam unremarkable apart from skin findings.  Multiple superficial abrasions to right knee and right forearm, not bleeding. Discussed with patient she can apply antibiotic ointment to these areas twice daily. Head laceration cleaned and  closed with 4 sutures. Antibiotic ointment applied. She can use tylenol as needed for pain, hot pad for muscle stiffness. Discussed return precautions and signs of infection to look for. Patient will return in 5-7 days for suture removal. She agrees to plan and is discharged in stable condition.   Final Clinical Impressions(s) / UC Diagnoses   Final diagnoses:  Laceration of scalp without foreign body, initial encounter     Discharge Instructions      Apply antibiotic ointment to the area twice daily. I also recommend using the ointment on your other abrasions.   Return in 5-7 days for suture removal.  Please return to the urgent care or emergency department if symptoms worsen or do not improve.    ED Prescriptions   None    PDMP not reviewed this encounter.   , , PA-C 04/26/22 1810  

## 2022-04-26 NOTE — Discharge Instructions (Addendum)
Apply antibiotic ointment to the area twice daily. I also recommend using the ointment on your other abrasions.   Return in 5-7 days for suture removal.  Please return to the urgent care or emergency department if symptoms worsen or do not improve.

## 2022-04-26 NOTE — ED Triage Notes (Signed)
Patient fell around 9:30 this morning while traveling [place].  Drove back to [employer].  Family member felt she needed stitches in face.  Patient hit right knee, right elbow and right side of face.    Walking in sandals on a rocky steep slope.   No loc.  Patient is not on any blood thinners  Scattered abrasions to right knee, right arm and right side of face.  Laceration to right forehead.  Bleeding controlled.

## 2022-05-12 ENCOUNTER — Ambulatory Visit
Admission: RE | Admit: 2022-05-12 | Discharge: 2022-05-12 | Disposition: A | Discharge status: Home / Self Care | Payer: Medicare HMO | Source: Ambulatory Visit | Attending: Registered Nurse | Admitting: Registered Nurse

## 2022-05-12 ENCOUNTER — Other Ambulatory Visit: Payer: Medicare HMO

## 2022-05-12 DIAGNOSIS — I700 Atherosclerosis of aorta: Secondary | ICD-10-CM | POA: Diagnosis not present

## 2022-05-12 DIAGNOSIS — R911 Solitary pulmonary nodule: Secondary | ICD-10-CM

## 2022-05-12 DIAGNOSIS — R918 Other nonspecific abnormal finding of lung field: Secondary | ICD-10-CM | POA: Diagnosis not present

## 2022-06-05 DIAGNOSIS — E785 Hyperlipidemia, unspecified: Secondary | ICD-10-CM | POA: Diagnosis not present

## 2022-06-15 DIAGNOSIS — E7800 Pure hypercholesterolemia, unspecified: Secondary | ICD-10-CM | POA: Diagnosis not present

## 2022-06-15 DIAGNOSIS — I700 Atherosclerosis of aorta: Secondary | ICD-10-CM | POA: Diagnosis not present

## 2022-06-15 DIAGNOSIS — E559 Vitamin D deficiency, unspecified: Secondary | ICD-10-CM | POA: Diagnosis not present

## 2022-06-15 DIAGNOSIS — E785 Hyperlipidemia, unspecified: Secondary | ICD-10-CM | POA: Diagnosis not present

## 2022-06-15 DIAGNOSIS — N1831 Chronic kidney disease, stage 3a: Secondary | ICD-10-CM | POA: Diagnosis not present

## 2022-06-15 DIAGNOSIS — I129 Hypertensive chronic kidney disease with stage 1 through stage 4 chronic kidney disease, or unspecified chronic kidney disease: Secondary | ICD-10-CM | POA: Diagnosis not present

## 2022-06-22 DIAGNOSIS — M25562 Pain in left knee: Secondary | ICD-10-CM | POA: Diagnosis not present

## 2022-06-28 DIAGNOSIS — M6281 Muscle weakness (generalized): Secondary | ICD-10-CM | POA: Diagnosis not present

## 2022-07-05 DIAGNOSIS — M6281 Muscle weakness (generalized): Secondary | ICD-10-CM | POA: Diagnosis not present

## 2022-07-12 DIAGNOSIS — M6281 Muscle weakness (generalized): Secondary | ICD-10-CM | POA: Diagnosis not present

## 2022-07-19 DIAGNOSIS — M6281 Muscle weakness (generalized): Secondary | ICD-10-CM | POA: Diagnosis not present

## 2022-08-10 DIAGNOSIS — M25562 Pain in left knee: Secondary | ICD-10-CM | POA: Diagnosis not present

## 2022-09-12 DIAGNOSIS — M25562 Pain in left knee: Secondary | ICD-10-CM | POA: Diagnosis not present

## 2022-09-27 DIAGNOSIS — D485 Neoplasm of uncertain behavior of skin: Secondary | ICD-10-CM | POA: Diagnosis not present

## 2022-09-27 DIAGNOSIS — L821 Other seborrheic keratosis: Secondary | ICD-10-CM | POA: Diagnosis not present

## 2022-09-27 DIAGNOSIS — D225 Melanocytic nevi of trunk: Secondary | ICD-10-CM | POA: Diagnosis not present

## 2022-10-05 DIAGNOSIS — Z1231 Encounter for screening mammogram for malignant neoplasm of breast: Secondary | ICD-10-CM | POA: Diagnosis not present

## 2022-12-11 DIAGNOSIS — N1831 Chronic kidney disease, stage 3a: Secondary | ICD-10-CM | POA: Diagnosis not present

## 2022-12-19 DIAGNOSIS — E538 Deficiency of other specified B group vitamins: Secondary | ICD-10-CM | POA: Diagnosis not present

## 2022-12-19 DIAGNOSIS — I10 Essential (primary) hypertension: Secondary | ICD-10-CM | POA: Diagnosis not present

## 2022-12-19 DIAGNOSIS — E785 Hyperlipidemia, unspecified: Secondary | ICD-10-CM | POA: Diagnosis not present

## 2022-12-19 DIAGNOSIS — E039 Hypothyroidism, unspecified: Secondary | ICD-10-CM | POA: Diagnosis not present

## 2022-12-19 DIAGNOSIS — I129 Hypertensive chronic kidney disease with stage 1 through stage 4 chronic kidney disease, or unspecified chronic kidney disease: Secondary | ICD-10-CM | POA: Diagnosis not present

## 2022-12-19 DIAGNOSIS — N1831 Chronic kidney disease, stage 3a: Secondary | ICD-10-CM | POA: Diagnosis not present

## 2022-12-19 DIAGNOSIS — N390 Urinary tract infection, site not specified: Secondary | ICD-10-CM | POA: Diagnosis not present

## 2022-12-19 DIAGNOSIS — K219 Gastro-esophageal reflux disease without esophagitis: Secondary | ICD-10-CM | POA: Diagnosis not present

## 2022-12-19 DIAGNOSIS — N281 Cyst of kidney, acquired: Secondary | ICD-10-CM | POA: Diagnosis not present

## 2022-12-19 DIAGNOSIS — E559 Vitamin D deficiency, unspecified: Secondary | ICD-10-CM | POA: Diagnosis not present

## 2022-12-26 DIAGNOSIS — E785 Hyperlipidemia, unspecified: Secondary | ICD-10-CM | POA: Diagnosis not present

## 2022-12-26 DIAGNOSIS — M8589 Other specified disorders of bone density and structure, multiple sites: Secondary | ICD-10-CM | POA: Diagnosis not present

## 2022-12-26 DIAGNOSIS — E559 Vitamin D deficiency, unspecified: Secondary | ICD-10-CM | POA: Diagnosis not present

## 2022-12-26 DIAGNOSIS — I10 Essential (primary) hypertension: Secondary | ICD-10-CM | POA: Diagnosis not present

## 2022-12-26 DIAGNOSIS — Z23 Encounter for immunization: Secondary | ICD-10-CM | POA: Diagnosis not present

## 2022-12-26 DIAGNOSIS — E039 Hypothyroidism, unspecified: Secondary | ICD-10-CM | POA: Diagnosis not present

## 2022-12-26 DIAGNOSIS — Z0000 Encounter for general adult medical examination without abnormal findings: Secondary | ICD-10-CM | POA: Diagnosis not present

## 2023-01-02 ENCOUNTER — Encounter: Payer: Self-pay | Admitting: *Deleted

## 2023-04-16 ENCOUNTER — Other Ambulatory Visit: Payer: Self-pay | Admitting: Cardiology

## 2023-06-26 DIAGNOSIS — E785 Hyperlipidemia, unspecified: Secondary | ICD-10-CM | POA: Diagnosis not present

## 2023-07-03 DIAGNOSIS — N1831 Chronic kidney disease, stage 3a: Secondary | ICD-10-CM | POA: Diagnosis not present

## 2023-07-03 DIAGNOSIS — E785 Hyperlipidemia, unspecified: Secondary | ICD-10-CM | POA: Diagnosis not present

## 2023-07-03 DIAGNOSIS — I10 Essential (primary) hypertension: Secondary | ICD-10-CM | POA: Diagnosis not present

## 2023-07-03 DIAGNOSIS — Z23 Encounter for immunization: Secondary | ICD-10-CM | POA: Diagnosis not present

## 2023-07-03 DIAGNOSIS — E039 Hypothyroidism, unspecified: Secondary | ICD-10-CM | POA: Diagnosis not present

## 2023-09-09 IMAGING — CT CT CHEST W/O CM
2 of 5 series · 15 of 36 positions shown, 18 images · non-contrast
Comparison: Cardiac CT dated 01/09/2022.

CLINICAL DATA: Pulmonary nodule follow-up.



[Series 4: chest 2.00 br40 s3 · coronal · 0.59mm/px · 3 of 150 slices shown]
[im 30/150  lung]
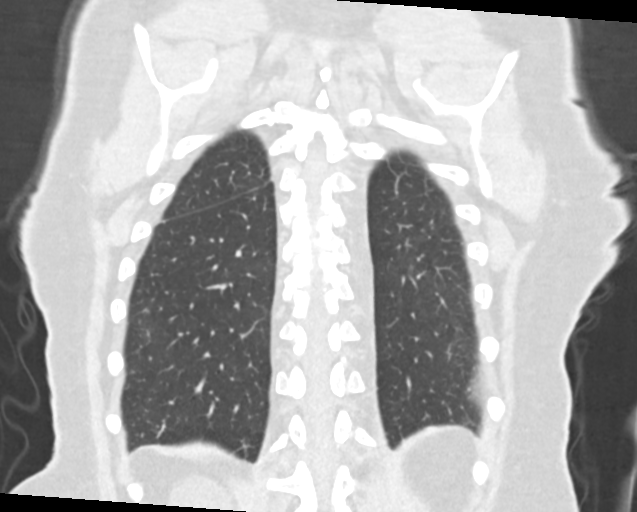
[im 60/150  lung]
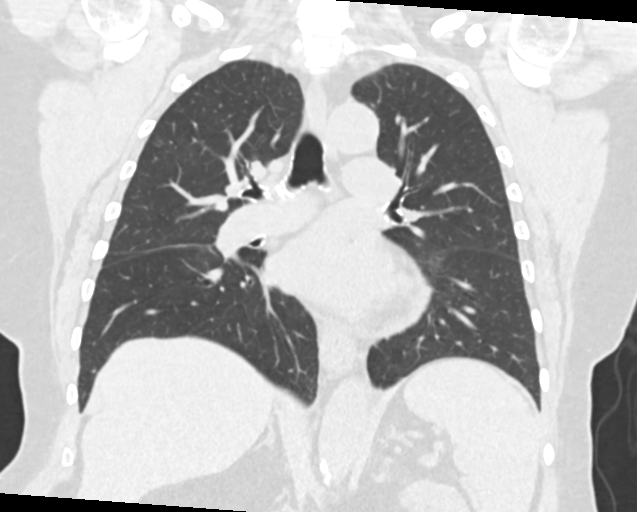
[im 90/150  lung]
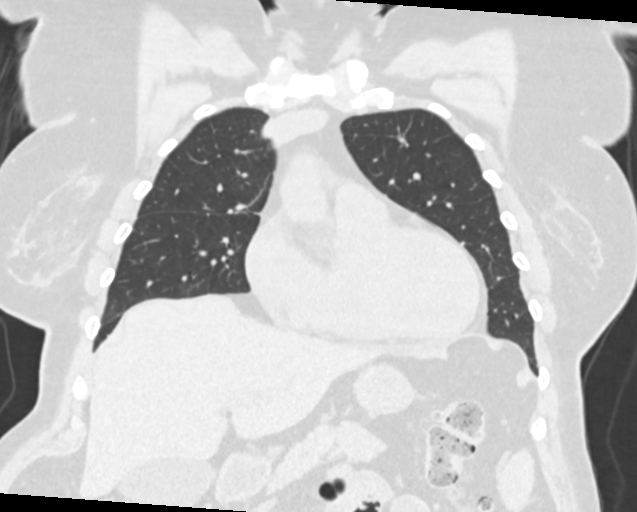

[Series 10: chest 1.00 br40 s3 super d · axial · 0.72mm/px · z∈[+1505,+1765]mm · 12 of 377 slices shown, 15 images]
[im 26/377  mediastinal]
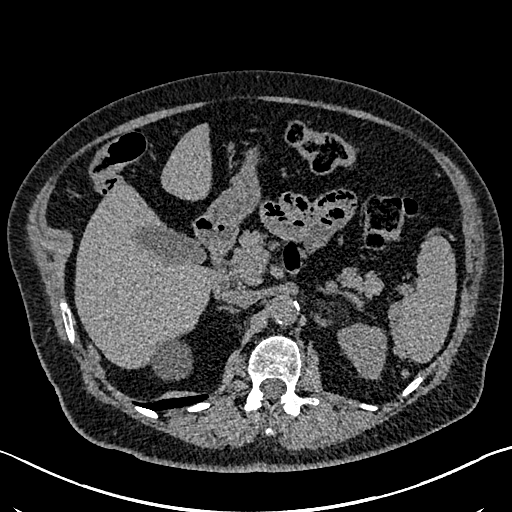
[im 26/377  lung]
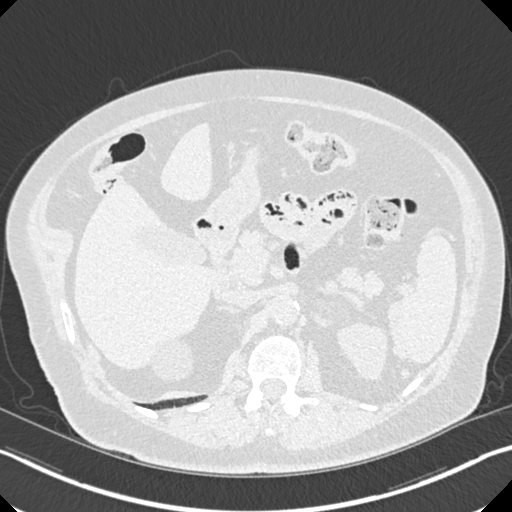
[im 51/377  lung]
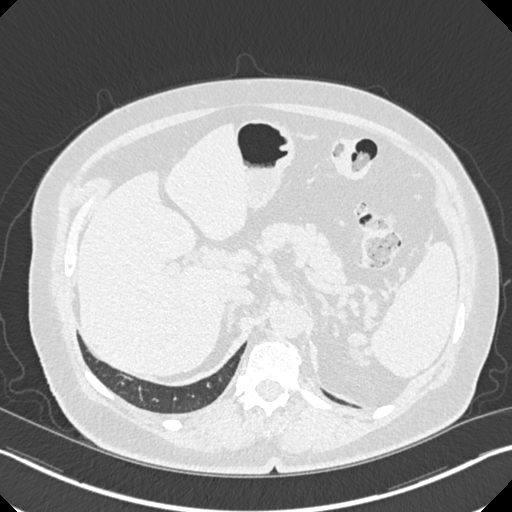
[im 76/377  lung]
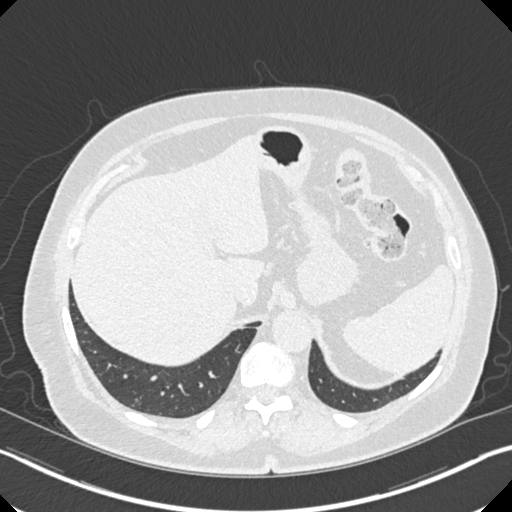
[im 126/377  lung]
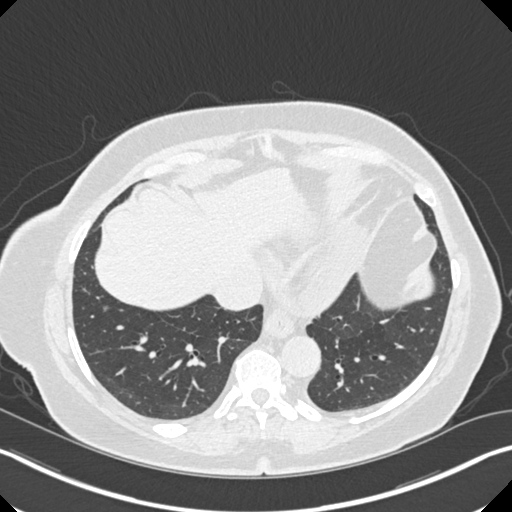
[im 151/377  mediastinal]
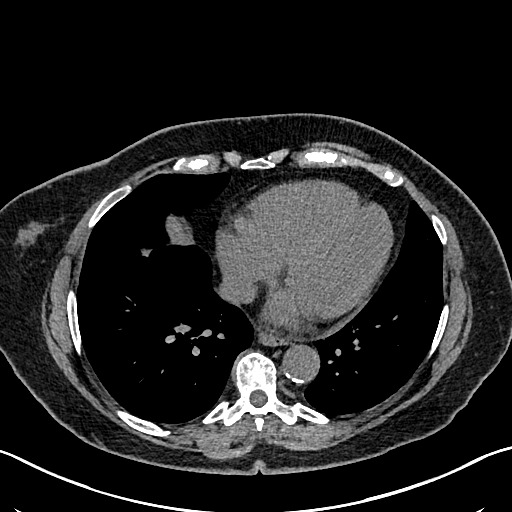
[im 151/377  lung]
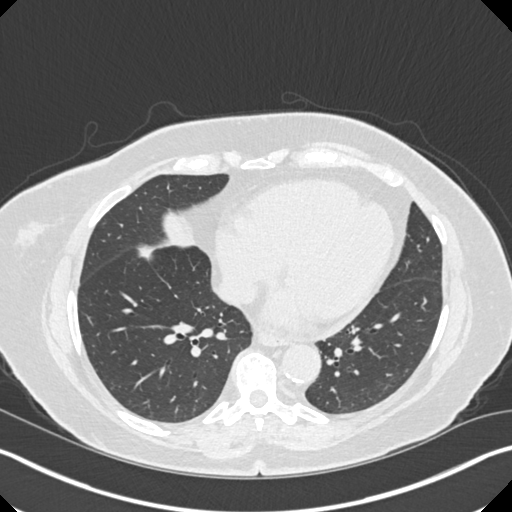
[im 176/377  lung]
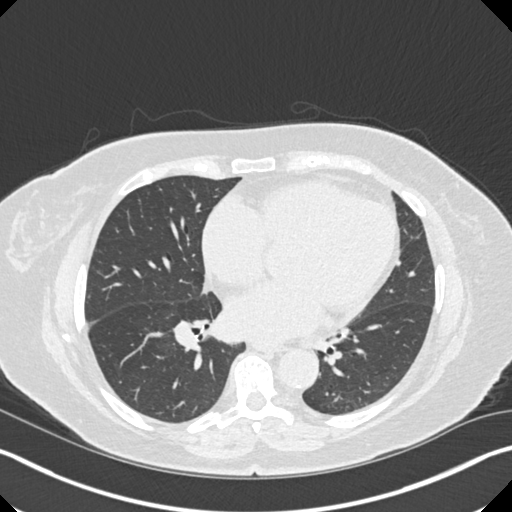
[im 201/377  lung]
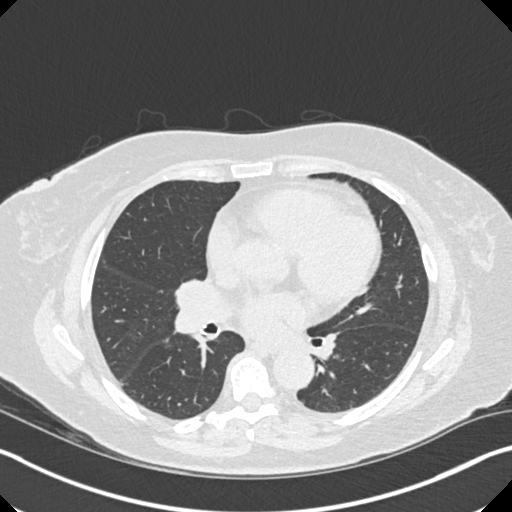
[im 226/377  lung]
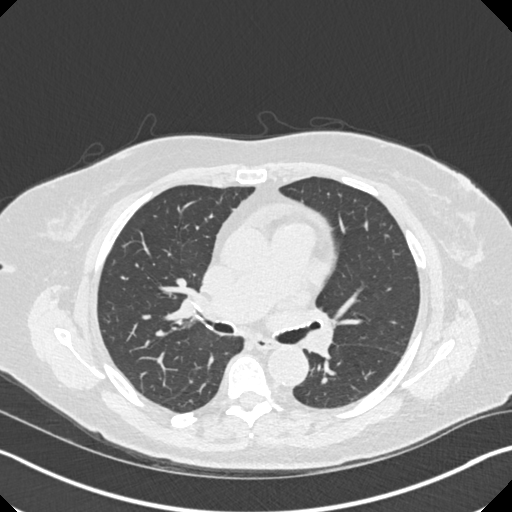
[im 251/377  mediastinal]
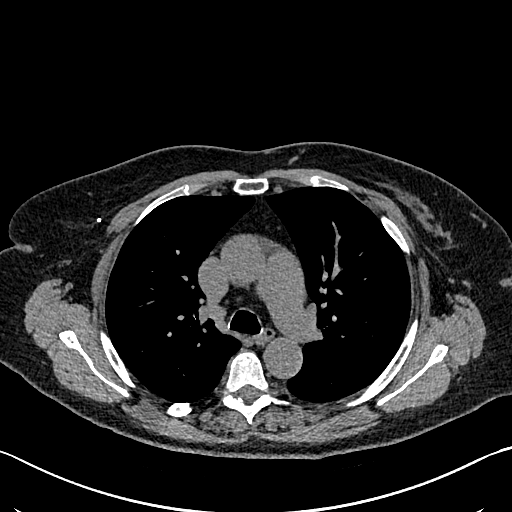
[im 251/377  lung]
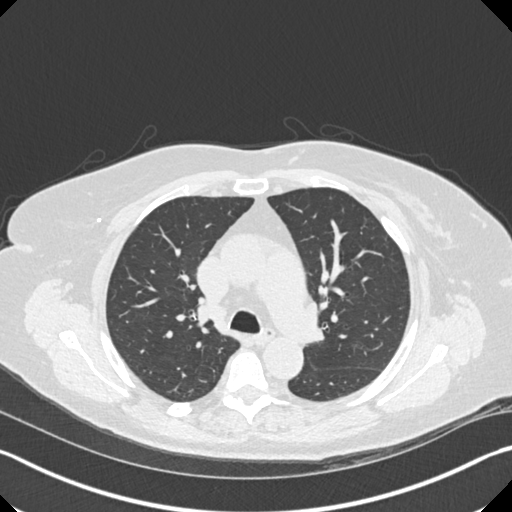
[im 301/377  lung]
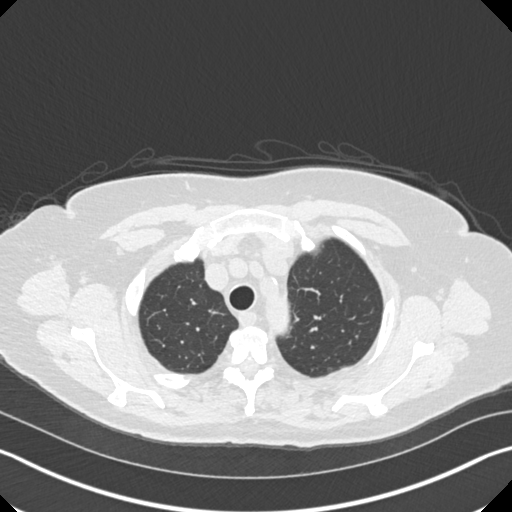
[im 326/377  lung]
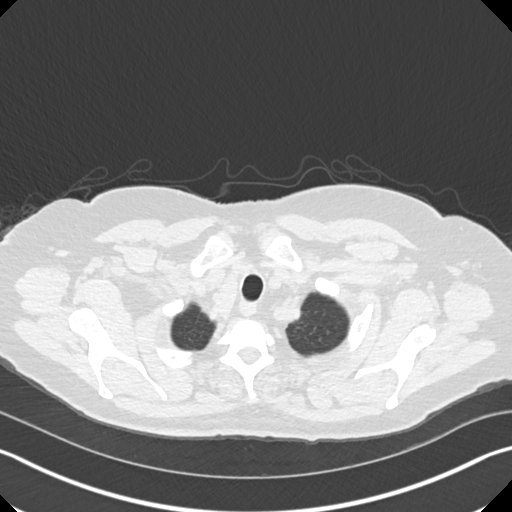
[im 351/377  lung]
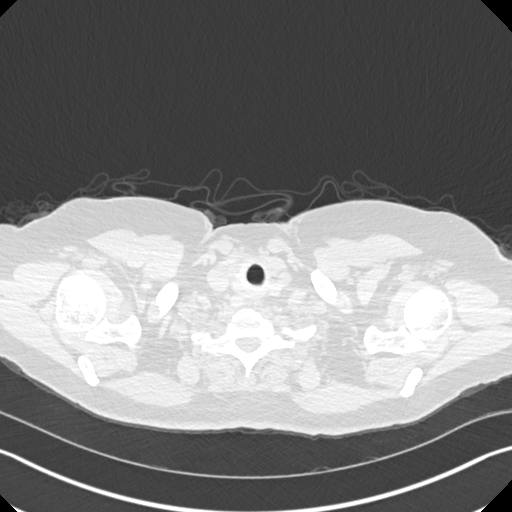

[15 of 36 positions shown; findings below may reference images not displayed]

FINDINGS: Evaluation of this exam is limited in the absence of intravenous
contrast.

Cardiovascular: There is no cardiomegaly or pericardial effusion.
There is coronary vascular calcification of the LAD. Mild
atherosclerotic calcification of the thoracic aorta. No aneurysmal
dilatation. The central pulmonary arteries are grossly unremarkable.

Mediastinum/Nodes: No hilar or mediastinal adenopathy. Small hiatal
hernia. The esophagus is grossly unremarkable. No mediastinal fluid
collection.

Lungs/Pleura: Several small scattered pulmonary nodules measure up
to 4 mm in the left lower lobe (85/8). No focal consolidation,
pleural effusion, or pneumothorax. The central airways are patent.

Upper Abdomen: Partially visualized cyst arising from the upper pole
of the right kidney. This is partially visualized and not evaluated
on this CT. Further evaluation with ultrasound recommended.
Ill-defined fatty lesion inferior to the left adrenal gland
measuring 2 cm. This may represent an adrenal myelolipoma.

Musculoskeletal: No acute osseous pathology.
IMPRESSION: 1. Several small pulmonary nodules measure up to 4 mm in the left
lower lobe. No follow-up needed if patient is low-risk (and has no
known or suspected primary neoplasm). Non-contrast chest CT can be
considered in 12 months if patient is high-risk. This recommendation
follows the consensus statement: Guidelines for Management of
Incidental Pulmonary Nodules Detected on CT Images: From the
2. Partially visualized cyst arising from the upper pole of the
right kidney. Further evaluation with ultrasound recommended.
3. Aortic Atherosclerosis ([id number]-VC3.3).

## 2023-09-18 DIAGNOSIS — D225 Melanocytic nevi of trunk: Secondary | ICD-10-CM | POA: Diagnosis not present

## 2023-09-18 DIAGNOSIS — L820 Inflamed seborrheic keratosis: Secondary | ICD-10-CM | POA: Diagnosis not present

## 2023-09-18 DIAGNOSIS — Z1283 Encounter for screening for malignant neoplasm of skin: Secondary | ICD-10-CM | POA: Diagnosis not present

## 2023-10-11 DIAGNOSIS — Z1231 Encounter for screening mammogram for malignant neoplasm of breast: Secondary | ICD-10-CM | POA: Diagnosis not present

## 2023-10-15 DIAGNOSIS — D3131 Benign neoplasm of right choroid: Secondary | ICD-10-CM | POA: Diagnosis not present

## 2023-10-15 DIAGNOSIS — H26493 Other secondary cataract, bilateral: Secondary | ICD-10-CM | POA: Diagnosis not present

## 2023-10-15 DIAGNOSIS — H5319 Other subjective visual disturbances: Secondary | ICD-10-CM | POA: Diagnosis not present

## 2023-10-15 DIAGNOSIS — H40013 Open angle with borderline findings, low risk, bilateral: Secondary | ICD-10-CM | POA: Diagnosis not present

## 2023-12-06 DIAGNOSIS — M25562 Pain in left knee: Secondary | ICD-10-CM | POA: Diagnosis not present

## 2023-12-26 DIAGNOSIS — E785 Hyperlipidemia, unspecified: Secondary | ICD-10-CM | POA: Diagnosis not present

## 2023-12-26 DIAGNOSIS — I169 Hypertensive crisis, unspecified: Secondary | ICD-10-CM | POA: Diagnosis not present

## 2023-12-26 DIAGNOSIS — E039 Hypothyroidism, unspecified: Secondary | ICD-10-CM | POA: Diagnosis not present

## 2023-12-26 DIAGNOSIS — E559 Vitamin D deficiency, unspecified: Secondary | ICD-10-CM | POA: Diagnosis not present

## 2023-12-26 DIAGNOSIS — E538 Deficiency of other specified B group vitamins: Secondary | ICD-10-CM | POA: Diagnosis not present

## 2023-12-26 DIAGNOSIS — Z0000 Encounter for general adult medical examination without abnormal findings: Secondary | ICD-10-CM | POA: Diagnosis not present

## 2023-12-27 LAB — LAB REPORT - SCANNED: TSH: 2.07 (ref 0.41–5.90)

## 2024-01-02 DIAGNOSIS — E559 Vitamin D deficiency, unspecified: Secondary | ICD-10-CM | POA: Diagnosis not present

## 2024-01-02 DIAGNOSIS — E785 Hyperlipidemia, unspecified: Secondary | ICD-10-CM | POA: Diagnosis not present

## 2024-01-02 DIAGNOSIS — I2510 Atherosclerotic heart disease of native coronary artery without angina pectoris: Secondary | ICD-10-CM | POA: Diagnosis not present

## 2024-01-02 DIAGNOSIS — I10 Essential (primary) hypertension: Secondary | ICD-10-CM | POA: Diagnosis not present

## 2024-01-02 DIAGNOSIS — N1831 Chronic kidney disease, stage 3a: Secondary | ICD-10-CM | POA: Diagnosis not present

## 2024-01-02 DIAGNOSIS — E538 Deficiency of other specified B group vitamins: Secondary | ICD-10-CM | POA: Diagnosis not present

## 2024-01-02 DIAGNOSIS — Z0000 Encounter for general adult medical examination without abnormal findings: Secondary | ICD-10-CM | POA: Diagnosis not present

## 2024-02-05 DIAGNOSIS — S80861A Insect bite (nonvenomous), right lower leg, initial encounter: Secondary | ICD-10-CM | POA: Diagnosis not present

## 2024-02-05 DIAGNOSIS — W57XXXA Bitten or stung by nonvenomous insect and other nonvenomous arthropods, initial encounter: Secondary | ICD-10-CM | POA: Diagnosis not present

## 2024-06-24 DIAGNOSIS — E785 Hyperlipidemia, unspecified: Secondary | ICD-10-CM | POA: Diagnosis not present

## 2024-06-24 LAB — LAB REPORT - SCANNED: EGFR: 47

## 2024-07-01 DIAGNOSIS — E538 Deficiency of other specified B group vitamins: Secondary | ICD-10-CM | POA: Diagnosis not present

## 2024-07-01 DIAGNOSIS — E039 Hypothyroidism, unspecified: Secondary | ICD-10-CM | POA: Diagnosis not present

## 2024-07-01 DIAGNOSIS — E785 Hyperlipidemia, unspecified: Secondary | ICD-10-CM | POA: Diagnosis not present

## 2024-07-01 DIAGNOSIS — I10 Essential (primary) hypertension: Secondary | ICD-10-CM | POA: Diagnosis not present

## 2024-07-01 DIAGNOSIS — I700 Atherosclerosis of aorta: Secondary | ICD-10-CM | POA: Diagnosis not present

## 2024-07-01 DIAGNOSIS — I2510 Atherosclerotic heart disease of native coronary artery without angina pectoris: Secondary | ICD-10-CM | POA: Diagnosis not present

## 2024-07-01 DIAGNOSIS — E559 Vitamin D deficiency, unspecified: Secondary | ICD-10-CM | POA: Diagnosis not present

## 2024-07-02 ENCOUNTER — Ambulatory Visit: Payer: Self-pay | Admitting: Cardiology

## 2024-08-26 DIAGNOSIS — M25571 Pain in right ankle and joints of right foot: Secondary | ICD-10-CM | POA: Diagnosis not present

## 2024-08-26 DIAGNOSIS — M25572 Pain in left ankle and joints of left foot: Secondary | ICD-10-CM | POA: Diagnosis not present

## 2024-09-17 DIAGNOSIS — D2271 Melanocytic nevi of right lower limb, including hip: Secondary | ICD-10-CM | POA: Diagnosis not present

## 2024-09-17 DIAGNOSIS — D2272 Melanocytic nevi of left lower limb, including hip: Secondary | ICD-10-CM | POA: Diagnosis not present

## 2024-09-17 DIAGNOSIS — D225 Melanocytic nevi of trunk: Secondary | ICD-10-CM | POA: Diagnosis not present

## 2024-09-17 DIAGNOSIS — D485 Neoplasm of uncertain behavior of skin: Secondary | ICD-10-CM | POA: Diagnosis not present

## 2024-09-17 DIAGNOSIS — L821 Other seborrheic keratosis: Secondary | ICD-10-CM | POA: Diagnosis not present

## 2024-10-15 DIAGNOSIS — H524 Presbyopia: Secondary | ICD-10-CM | POA: Diagnosis not present

## 2024-10-15 DIAGNOSIS — H02056 Trichiasis without entropian left eye, unspecified eyelid: Secondary | ICD-10-CM | POA: Diagnosis not present

## 2024-10-15 DIAGNOSIS — D3131 Benign neoplasm of right choroid: Secondary | ICD-10-CM | POA: Diagnosis not present

## 2024-10-15 DIAGNOSIS — H40013 Open angle with borderline findings, low risk, bilateral: Secondary | ICD-10-CM | POA: Diagnosis not present

## 2024-10-15 DIAGNOSIS — H5319 Other subjective visual disturbances: Secondary | ICD-10-CM | POA: Diagnosis not present

## 2024-10-16 DIAGNOSIS — Z1231 Encounter for screening mammogram for malignant neoplasm of breast: Secondary | ICD-10-CM | POA: Diagnosis not present

## 2024-12-22 ENCOUNTER — Other Ambulatory Visit (HOSPITAL_COMMUNITY): Payer: Self-pay | Admitting: Registered Nurse

## 2024-12-22 DIAGNOSIS — R011 Cardiac murmur, unspecified: Secondary | ICD-10-CM

## 2025-01-21 ENCOUNTER — Ambulatory Visit (HOSPITAL_COMMUNITY)

## 2025-01-23 ENCOUNTER — Ambulatory Visit: Admitting: Cardiology
